# Patient Record
Sex: Female | Born: 1955
Health system: Southern US, Community
[De-identification: ages and names within clinical notes are randomized; demographics above are authoritative.]

## PROBLEM LIST (undated history)

## (undated) DIAGNOSIS — H409 Unspecified glaucoma: Secondary | ICD-10-CM

## (undated) DIAGNOSIS — R32 Unspecified urinary incontinence: Secondary | ICD-10-CM

## (undated) DIAGNOSIS — E039 Hypothyroidism, unspecified: Secondary | ICD-10-CM

## (undated) DIAGNOSIS — F32A Depression, unspecified: Secondary | ICD-10-CM

## (undated) DIAGNOSIS — F419 Anxiety disorder, unspecified: Secondary | ICD-10-CM

## (undated) DIAGNOSIS — R319 Hematuria, unspecified: Secondary | ICD-10-CM

## (undated) DIAGNOSIS — Z79890 Hormone replacement therapy (postmenopausal): Secondary | ICD-10-CM

## (undated) DIAGNOSIS — Z981 Arthrodesis status: Secondary | ICD-10-CM

## (undated) DIAGNOSIS — I10 Essential (primary) hypertension: Secondary | ICD-10-CM

## (undated) DIAGNOSIS — I7300 Raynaud's syndrome without gangrene: Secondary | ICD-10-CM

## (undated) DIAGNOSIS — M8580 Other specified disorders of bone density and structure, unspecified site: Secondary | ICD-10-CM

## (undated) DIAGNOSIS — E041 Nontoxic single thyroid nodule: Secondary | ICD-10-CM

## (undated) DIAGNOSIS — F329 Major depressive disorder, single episode, unspecified: Secondary | ICD-10-CM

## (undated) HISTORY — PX: FOOT TENDON SURGERY: SHX958

## (undated) HISTORY — PX: DIAGNOSTIC LAPAROSCOPY: SUR761

## (undated) HISTORY — DX: Essential (primary) hypertension: I10

## (undated) HISTORY — DX: Nontoxic single thyroid nodule: E04.1

## (undated) HISTORY — DX: Hormone replacement therapy: Z79.890

## (undated) HISTORY — PX: BACK SURGERY: SHX140

## (undated) HISTORY — DX: Raynaud's syndrome without gangrene: I73.00

## (undated) HISTORY — PX: TUBAL LIGATION: SHX77

## (undated) HISTORY — DX: Other specified disorders of bone density and structure, unspecified site: M85.80

## (undated) HISTORY — DX: Arthrodesis status: Z98.1

## (undated) HISTORY — DX: Hypothyroidism, unspecified: E03.9

## (undated) HISTORY — DX: Unspecified urinary incontinence: R32

---

## 1997-06-12 ENCOUNTER — Inpatient Hospital Stay (HOSPITAL_COMMUNITY): Admission: RE | Admit: 1997-06-12 | Discharge: 1997-06-17 | Payer: Self-pay | Admitting: Neurosurgery

## 2000-09-24 ENCOUNTER — Encounter: Payer: Self-pay | Admitting: Internal Medicine

## 2000-09-24 ENCOUNTER — Ambulatory Visit (HOSPITAL_COMMUNITY): Admission: RE | Admit: 2000-09-24 | Discharge: 2000-09-24 | Payer: Self-pay | Admitting: Internal Medicine

## 2000-10-12 ENCOUNTER — Other Ambulatory Visit: Admission: RE | Admit: 2000-10-12 | Discharge: 2000-10-12 | Payer: Self-pay | Admitting: Obstetrics and Gynecology

## 2001-10-26 ENCOUNTER — Encounter: Payer: Self-pay | Admitting: Internal Medicine

## 2001-10-26 ENCOUNTER — Ambulatory Visit (HOSPITAL_COMMUNITY): Admission: RE | Admit: 2001-10-26 | Discharge: 2001-10-26 | Payer: Self-pay | Admitting: Internal Medicine

## 2002-11-14 ENCOUNTER — Ambulatory Visit (HOSPITAL_COMMUNITY): Admission: RE | Admit: 2002-11-14 | Discharge: 2002-11-14 | Payer: Self-pay | Admitting: Internal Medicine

## 2002-11-14 ENCOUNTER — Encounter: Payer: Self-pay | Admitting: Internal Medicine

## 2003-02-05 ENCOUNTER — Ambulatory Visit (HOSPITAL_COMMUNITY): Admission: RE | Admit: 2003-02-05 | Discharge: 2003-02-05 | Payer: Self-pay | Admitting: Urology

## 2003-07-27 ENCOUNTER — Ambulatory Visit (HOSPITAL_COMMUNITY): Admission: RE | Admit: 2003-07-27 | Discharge: 2003-07-27 | Payer: Self-pay | Admitting: Neurosurgery

## 2003-11-29 ENCOUNTER — Ambulatory Visit (HOSPITAL_COMMUNITY): Admission: RE | Admit: 2003-11-29 | Discharge: 2003-11-29 | Payer: Self-pay | Admitting: Obstetrics and Gynecology

## 2004-06-19 ENCOUNTER — Ambulatory Visit (HOSPITAL_COMMUNITY): Admission: RE | Admit: 2004-06-19 | Discharge: 2004-06-19 | Payer: Self-pay | Admitting: Internal Medicine

## 2004-12-01 ENCOUNTER — Ambulatory Visit (HOSPITAL_COMMUNITY): Admission: RE | Admit: 2004-12-01 | Discharge: 2004-12-01 | Payer: Self-pay | Admitting: Internal Medicine

## 2006-07-01 ENCOUNTER — Ambulatory Visit (HOSPITAL_COMMUNITY): Admission: RE | Admit: 2006-07-01 | Discharge: 2006-07-01 | Payer: Self-pay | Admitting: Internal Medicine

## 2007-03-27 ENCOUNTER — Encounter: Admission: RE | Admit: 2007-03-27 | Discharge: 2007-03-27 | Payer: Self-pay | Admitting: Neurosurgery

## 2007-12-08 ENCOUNTER — Ambulatory Visit (HOSPITAL_COMMUNITY): Admission: RE | Admit: 2007-12-08 | Discharge: 2007-12-08 | Payer: Self-pay | Admitting: Internal Medicine

## 2008-05-21 ENCOUNTER — Other Ambulatory Visit: Admission: RE | Admit: 2008-05-21 | Discharge: 2008-05-21 | Payer: Self-pay | Admitting: Obstetrics and Gynecology

## 2008-06-25 ENCOUNTER — Ambulatory Visit (HOSPITAL_COMMUNITY): Admission: RE | Admit: 2008-06-25 | Discharge: 2008-06-25 | Payer: Self-pay | Admitting: Obstetrics & Gynecology

## 2009-04-12 ENCOUNTER — Ambulatory Visit (HOSPITAL_COMMUNITY): Admission: RE | Admit: 2009-04-12 | Discharge: 2009-04-12 | Payer: Self-pay | Admitting: Internal Medicine

## 2009-05-10 ENCOUNTER — Ambulatory Visit (HOSPITAL_COMMUNITY): Admission: RE | Admit: 2009-05-10 | Discharge: 2009-05-10 | Payer: Self-pay | Admitting: Obstetrics & Gynecology

## 2009-09-17 ENCOUNTER — Ambulatory Visit (HOSPITAL_COMMUNITY): Admission: RE | Admit: 2009-09-17 | Discharge: 2009-09-17 | Payer: Self-pay | Admitting: Obstetrics & Gynecology

## 2009-12-19 ENCOUNTER — Ambulatory Visit (HOSPITAL_COMMUNITY): Admission: RE | Admit: 2009-12-19 | Discharge: 2009-12-19 | Payer: Self-pay | Admitting: Internal Medicine

## 2010-03-02 ENCOUNTER — Encounter: Payer: Self-pay | Admitting: Internal Medicine

## 2010-09-11 ENCOUNTER — Ambulatory Visit (HOSPITAL_COMMUNITY): Admission: RE | Admit: 2010-09-11 | Payer: Self-pay | Source: Ambulatory Visit | Admitting: Internal Medicine

## 2010-09-11 ENCOUNTER — Encounter (INDEPENDENT_AMBULATORY_CARE_PROVIDER_SITE_OTHER): Payer: Self-pay | Admitting: Internal Medicine

## 2010-09-11 ENCOUNTER — Encounter (HOSPITAL_COMMUNITY): Admission: RE | Payer: Self-pay | Source: Ambulatory Visit

## 2010-09-11 SURGERY — COLONOSCOPY
Anesthesia: Moderate Sedation

## 2011-05-11 ENCOUNTER — Other Ambulatory Visit (HOSPITAL_COMMUNITY): Payer: Self-pay | Admitting: Internal Medicine

## 2011-05-11 DIAGNOSIS — Z139 Encounter for screening, unspecified: Secondary | ICD-10-CM

## 2011-05-14 ENCOUNTER — Ambulatory Visit (HOSPITAL_COMMUNITY): Payer: Self-pay

## 2011-05-21 ENCOUNTER — Ambulatory Visit (HOSPITAL_COMMUNITY)
Admission: RE | Admit: 2011-05-21 | Discharge: 2011-05-21 | Disposition: A | Discharge status: Home / Self Care | Payer: Medicare Other | Source: Ambulatory Visit | Attending: Internal Medicine | Admitting: Internal Medicine

## 2011-05-21 DIAGNOSIS — Z139 Encounter for screening, unspecified: Secondary | ICD-10-CM

## 2011-05-21 DIAGNOSIS — Z1231 Encounter for screening mammogram for malignant neoplasm of breast: Secondary | ICD-10-CM | POA: Insufficient documentation

## 2011-05-27 ENCOUNTER — Other Ambulatory Visit (HOSPITAL_COMMUNITY)
Admission: RE | Admit: 2011-05-27 | Discharge: 2011-05-27 | Disposition: A | Discharge status: Home / Self Care | Payer: Medicare Other | Source: Ambulatory Visit | Attending: Obstetrics and Gynecology | Admitting: Obstetrics and Gynecology

## 2011-05-27 ENCOUNTER — Other Ambulatory Visit: Payer: Self-pay | Admitting: Adult Health

## 2011-05-27 DIAGNOSIS — Z124 Encounter for screening for malignant neoplasm of cervix: Secondary | ICD-10-CM | POA: Insufficient documentation

## 2011-11-16 ENCOUNTER — Encounter (INDEPENDENT_AMBULATORY_CARE_PROVIDER_SITE_OTHER): Payer: Self-pay | Admitting: *Deleted

## 2011-12-31 ENCOUNTER — Encounter (INDEPENDENT_AMBULATORY_CARE_PROVIDER_SITE_OTHER): Payer: Self-pay | Admitting: *Deleted

## 2011-12-31 ENCOUNTER — Telehealth (INDEPENDENT_AMBULATORY_CARE_PROVIDER_SITE_OTHER): Payer: Self-pay | Admitting: *Deleted

## 2011-12-31 ENCOUNTER — Other Ambulatory Visit (INDEPENDENT_AMBULATORY_CARE_PROVIDER_SITE_OTHER): Payer: Self-pay | Admitting: *Deleted

## 2011-12-31 DIAGNOSIS — Z800 Family history of malignant neoplasm of digestive organs: Secondary | ICD-10-CM

## 2011-12-31 DIAGNOSIS — Z1211 Encounter for screening for malignant neoplasm of colon: Secondary | ICD-10-CM

## 2011-12-31 MED ORDER — PEG-KCL-NACL-NASULF-NA ASC-C 100 G PO SOLR: 1.0000 | Freq: Once | ORAL | Status: DC

## 2011-12-31 NOTE — Telephone Encounter (Signed)
Patient needs movi prep 

## 2012-02-18 ENCOUNTER — Telehealth (INDEPENDENT_AMBULATORY_CARE_PROVIDER_SITE_OTHER): Payer: Self-pay | Admitting: *Deleted

## 2012-02-18 NOTE — Telephone Encounter (Signed)
  Procedure: tcs  Reason/Indication:  Screening, fam hx colon ca  Has patient had this procedure before?  yes  If so, when, by whom and where?  1996  Is there a family history of colon cancer?  yes  Who?  What age when diagnosed?  mother  Is patient diabetic?   no      Does patient have prosthetic heart valve?  no  Do you have a pacemaker?  no  Has patient had joint replacement within last 12 months?  no  Is patient on Coumadin, Plavix and/or Aspirin? no  Medications: trazadone 100 mg daily, zolpidem 10 mg daily, hydrocodone 5/500, lorazepam 0.5 mg prn, escitalopram 20 mg daily, phenergan prn, lomotil prn, vit d 50,000 iu 2 tab weekly, latanoprost eye drop 0.005% 1 drop eye each @ Bedtime  Allergies: narcotics make her itch  Medication Adjustment:   Procedure date & time: 03/16/12 at 730

## 2012-02-18 NOTE — Telephone Encounter (Signed)
agree

## 2012-03-04 ENCOUNTER — Encounter (HOSPITAL_COMMUNITY): Payer: Self-pay | Admitting: Pharmacy Technician

## 2012-03-16 ENCOUNTER — Ambulatory Visit (HOSPITAL_COMMUNITY)
Admission: RE | Admit: 2012-03-16 | Discharge: 2012-03-16 | Disposition: A | Discharge status: Home / Self Care | Payer: Medicare Other | Source: Ambulatory Visit | Attending: Internal Medicine | Admitting: Internal Medicine

## 2012-03-16 ENCOUNTER — Encounter (HOSPITAL_COMMUNITY): Payer: Self-pay

## 2012-03-16 ENCOUNTER — Encounter (HOSPITAL_COMMUNITY)
Admission: RE | Disposition: A | Discharge status: Home / Self Care | Payer: Self-pay | Source: Ambulatory Visit | Attending: Internal Medicine

## 2012-03-16 DIAGNOSIS — Z1211 Encounter for screening for malignant neoplasm of colon: Secondary | ICD-10-CM

## 2012-03-16 DIAGNOSIS — D126 Benign neoplasm of colon, unspecified: Secondary | ICD-10-CM | POA: Insufficient documentation

## 2012-03-16 DIAGNOSIS — Z800 Family history of malignant neoplasm of digestive organs: Secondary | ICD-10-CM

## 2012-03-16 HISTORY — PX: COLONOSCOPY: SHX5424

## 2012-03-16 HISTORY — DX: Unspecified glaucoma: H40.9

## 2012-03-16 HISTORY — DX: Anxiety disorder, unspecified: F41.9

## 2012-03-16 HISTORY — DX: Major depressive disorder, single episode, unspecified: F32.9

## 2012-03-16 HISTORY — DX: Hypothyroidism, unspecified: E03.9

## 2012-03-16 HISTORY — DX: Hematuria, unspecified: R31.9

## 2012-03-16 HISTORY — DX: Depression, unspecified: F32.A

## 2012-03-16 SURGERY — COLONOSCOPY
Anesthesia: Moderate Sedation

## 2012-03-16 MED ORDER — MIDAZOLAM HCL 5 MG/5ML IJ SOLN
INTRAMUSCULAR | Status: AC
  Filled 2012-03-16: qty 5

## 2012-03-16 MED ORDER — STERILE WATER FOR IRRIGATION IR SOLN
Status: DC | PRN
  Administered 2012-03-16: 08:00:00

## 2012-03-16 MED ORDER — MIDAZOLAM HCL 5 MG/5ML IJ SOLN
INTRAMUSCULAR | Status: DC | PRN
  Administered 2012-03-16: 3 mg via INTRAVENOUS
  Administered 2012-03-16 (×3): 2 mg via INTRAVENOUS
  Administered 2012-03-16 (×2): 3 mg via INTRAVENOUS

## 2012-03-16 MED ORDER — SODIUM CHLORIDE 0.45 % IV SOLN
INTRAVENOUS | Status: DC
  Administered 2012-03-16: 07:00:00 via INTRAVENOUS

## 2012-03-16 MED ORDER — FENTANYL CITRATE 0.05 MG/ML IJ SOLN
INTRAMUSCULAR | Status: AC
  Filled 2012-03-16: qty 2

## 2012-03-16 MED ORDER — PROMETHAZINE HCL 25 MG/ML IJ SOLN
INTRAMUSCULAR | Status: AC
  Filled 2012-03-16: qty 1

## 2012-03-16 MED ORDER — MIDAZOLAM HCL 5 MG/5ML IJ SOLN
INTRAMUSCULAR | Status: AC
  Filled 2012-03-16: qty 10

## 2012-03-16 MED ORDER — PROMETHAZINE HCL 25 MG/ML IJ SOLN
INTRAMUSCULAR | Status: DC | PRN
  Administered 2012-03-16 (×2): 12.5 mg via INTRAVENOUS

## 2012-03-16 MED ORDER — FENTANYL CITRATE 0.05 MG/ML IJ SOLN
INTRAMUSCULAR | Status: DC | PRN
  Administered 2012-03-16 (×4): 25 ug via INTRAVENOUS

## 2012-03-16 NOTE — H&P (Signed)
Heather Gill is an 57 y.o. female.   Chief Complaint: Patient is here for colonoscopy. HPI: Patient is 57 year old Caucasian female who is here for screening colonoscopy. Her last exam was in 1996. She has chronic/intermittent diarrhea felt to be due to IBS. She takes Lomotil on when necessary basis. She denies abdominal pain rectal bleeding. His appetite her weight has been stable. Family history significant for colon carcinoma mother she is 85 time of diagnosis and died at 76.  Past Medical History  Diagnosis Date  . Glaucoma   . Anxiety   . Depression   . Hypothyroidism   . Blood in urine     Dr Rito Ehrlich told her 6 yrs ago-no problems    Past Surgical History  Procedure Date  . Tubal ligation   . Diagnostic laparoscopy     dx  . Back surgery     3 surg-last with fusion and rods    History reviewed. No pertinent family history. Social History:  reports that she has never smoked. She does not have any smokeless tobacco history on file. She reports that she drinks about 2.4 ounces of alcohol per week. She reports that she does not use illicit drugs.  Allergies:  Allergies  Allergen Reactions  . Demerol (Meperidine) Itching  . Lortab (Hydrocodone-Acetaminophen) Itching  . Morphine And Related Itching  . Percocet (Oxycodone-Acetaminophen) Itching    Medications Prior to Admission  Medication Sig Dispense Refill  . Calcium Citrate-Vitamin D 500-400 MG-UNIT CHEW Chew 1 tablet by mouth daily.      . diphenoxylate-atropine (LOMOTIL) 2.5-0.025 MG per tablet Take 1 tablet by mouth 4 (four) times daily as needed. Diarrhea      . HYDROcodone-acetaminophen (NORCO/VICODIN) 5-325 MG per tablet Take 1 tablet by mouth every 6 (six) hours as needed. Pain, usually takes a benadryl wit it to control itching.      . latanoprost (XALATAN) 0.005 % ophthalmic solution Place 1 drop into both eyes at bedtime.      Marland Kitchen LORazepam (ATIVAN) 0.5 MG tablet Take 0.5 mg by mouth 3 (three) times daily as  needed. Anxiety      . Multiple Vitamin (MULTIVITAMIN WITH MINERALS) TABS Take 1 tablet by mouth daily.      . peg 3350 powder (MOVIPREP) 100 G SOLR Take 1 kit (100 g total) by mouth once.  1 kit  0  . promethazine (PHENERGAN) 25 MG tablet Take 25 mg by mouth every 6 (six) hours as needed. Nausea and Vomiting      . traZODone (DESYREL) 100 MG tablet Take 100 mg by mouth at bedtime.      Marland Kitchen venlafaxine XR (EFFEXOR-XR) 37.5 MG 24 hr capsule Take 37.5 mg by mouth daily.      Marland Kitchen zolpidem (AMBIEN) 10 MG tablet Take 10 mg by mouth at bedtime. Sleep        No results found for this or any previous visit (from the past 48 hour(s)). No results found.  ROS  Blood pressure 137/93, pulse 93, temperature 98.2 F (36.8 C), temperature source Oral, resp. rate 21, height 5' 6.5" (1.689 m), weight 169 lb (76.658 kg), SpO2 99.00%. Physical Exam  Constitutional: She appears well-developed and well-nourished.  HENT:  Mouth/Throat: Oropharynx is clear and moist.  Eyes: Conjunctivae normal are normal. No scleral icterus.  Neck: No thyromegaly present.  Cardiovascular: Normal rate, regular rhythm and normal heart sounds.   No murmur heard. Respiratory: Effort normal and breath sounds normal.  GI: Soft. She exhibits  no distension and no mass. There is no tenderness.  Musculoskeletal: She exhibits no edema.  Lymphadenopathy:    She has no cervical adenopathy.  Neurological: She is alert.  Skin: Skin is warm and dry.     Assessment/Plan Average risk screening colonoscopy. History of colon carcinoma in first degree relative but late onset.  REHMAN,NAJEEB U 03/16/2012, 7:38 AM

## 2012-03-16 NOTE — Op Note (Signed)
COLONOSCOPY PROCEDURE REPORT  PATIENT:  Heather Gill  MR#:  [id number] Birthdate:  1955/11/24, 57 y.o., female Endoscopist:  Dr. Malissa Hippo, MD Referred By:  Dr. Carylon Perches, MD Procedure Date: 03/16/2012  Procedure:   Colonoscopy  Indications:  Patient is 57 year old Caucasian female was undergoing screening colonoscopy. Family history significant for colon carcinoma in her mother who is 32 at the time of diagnosis.  Informed Consent:  The procedure and risks were reviewed with the patient and informed consent was obtained.  Medications:  Fentanyl 100 mcg IV Versed 15 mg IV Promethazine 25 mg IV in diluted form.  Description of procedure:  After a digital rectal exam was performed, that colonoscope was advanced from the anus through the rectum and colon to the area of the cecum, ileocecal valve and appendiceal orifice. The cecum was deeply intubated. These structures were well-seen and photographed for the record. From the level of the cecum and ileocecal valve, the scope was slowly and cautiously withdrawn. The mucosal surfaces were carefully surveyed utilizing scope tip to flexion to facilitate fold flattening as needed. The scope was pulled down into the rectum where a thorough exam including retroflexion was performed.  Findings:   Prep excellent. 4 mm polyp ablated via cold biopsy from hepatic flexure. Mucosa of rest of the colon was normal. Normal rectal mucosa and anal rectal junction.  Therapeutic/Diagnostic Maneuvers Performed:  See above  Complications:  None  Cecal Withdrawal Time:  7 minutes  Impression:  Examination performed to cecum. Small polyp ablated via cold biopsy from hepatic flexure.  Recommendations:  Standard instructions given. I will contact patient with biopsy results and further recommendations.  REHMAN,NAJEEB U  03/16/2012 8:44 AM  CC: Dr. Carylon Perches, MD & Dr. Bonnetta Barry ref. provider found

## 2012-03-21 ENCOUNTER — Encounter (HOSPITAL_COMMUNITY): Payer: Self-pay | Admitting: Internal Medicine

## 2012-03-23 ENCOUNTER — Encounter (INDEPENDENT_AMBULATORY_CARE_PROVIDER_SITE_OTHER): Payer: Self-pay | Admitting: *Deleted

## 2012-08-29 ENCOUNTER — Other Ambulatory Visit (HOSPITAL_COMMUNITY): Payer: Self-pay | Admitting: Internal Medicine

## 2012-08-29 DIAGNOSIS — Z139 Encounter for screening, unspecified: Secondary | ICD-10-CM

## 2012-09-12 ENCOUNTER — Ambulatory Visit (HOSPITAL_COMMUNITY): Payer: Medicare Other

## 2012-09-27 ENCOUNTER — Other Ambulatory Visit: Payer: Self-pay | Admitting: Adult Health

## 2013-01-17 ENCOUNTER — Telehealth: Payer: Self-pay | Admitting: Adult Health

## 2013-01-17 MED ORDER — CONJ ESTROGENS-BAZEDOXIFENE 0.45-20 MG PO TABS: 1.0000 | ORAL_TABLET | Freq: Every day | ORAL | Status: DC

## 2013-01-17 NOTE — Telephone Encounter (Signed)
Complains of hot flashes, severe, will try duavee lot [id number] exp 8/16 #21 samples given 1 daily

## 2013-02-07 ENCOUNTER — Other Ambulatory Visit: Payer: Self-pay | Admitting: *Deleted

## 2013-02-07 ENCOUNTER — Telehealth: Payer: Self-pay | Admitting: Adult Health

## 2013-02-07 MED ORDER — CONJ ESTROGENS-BAZEDOXIFENE 0.45-20 MG PO TABS: 1.0000 | ORAL_TABLET | Freq: Every day | ORAL | Status: DC

## 2013-02-07 NOTE — Telephone Encounter (Signed)
Is much better with duavee will call in refills

## 2013-02-14 ENCOUNTER — Other Ambulatory Visit: Payer: Self-pay | Admitting: Adult Health

## 2013-02-23 ENCOUNTER — Ambulatory Visit (INDEPENDENT_AMBULATORY_CARE_PROVIDER_SITE_OTHER): Payer: Medicare Other | Admitting: Adult Health

## 2013-02-23 ENCOUNTER — Other Ambulatory Visit (HOSPITAL_COMMUNITY)
Admission: RE | Admit: 2013-02-23 | Discharge: 2013-02-23 | Disposition: A | Discharge status: Home / Self Care | Payer: Medicare Other | Source: Ambulatory Visit | Attending: Adult Health | Admitting: Adult Health

## 2013-02-23 ENCOUNTER — Encounter: Payer: Self-pay | Admitting: Adult Health

## 2013-02-23 VITALS — BP 130/94 | HR 76 | Ht 66.0 in | Wt 187.0 lb

## 2013-02-23 DIAGNOSIS — Z79890 Hormone replacement therapy (postmenopausal): Secondary | ICD-10-CM | POA: Insufficient documentation

## 2013-02-23 DIAGNOSIS — Z1212 Encounter for screening for malignant neoplasm of rectum: Secondary | ICD-10-CM

## 2013-02-23 DIAGNOSIS — R32 Unspecified urinary incontinence: Secondary | ICD-10-CM

## 2013-02-23 DIAGNOSIS — Z1151 Encounter for screening for human papillomavirus (HPV): Secondary | ICD-10-CM | POA: Insufficient documentation

## 2013-02-23 DIAGNOSIS — Z01419 Encounter for gynecological examination (general) (routine) without abnormal findings: Secondary | ICD-10-CM | POA: Insufficient documentation

## 2013-02-23 HISTORY — DX: Unspecified urinary incontinence: R32

## 2013-02-23 LAB — HEMOCCULT GUIAC POC 1CARD (OFFICE): Fecal Occult Blood, POC: NEGATIVE

## 2013-02-23 NOTE — Progress Notes (Signed)
Patient ID: Heather Gill, female   DOB: 09/05/1955, 58 y.o.   MRN: [id number] History of Present Illness: Heather Gill is a 58 year old white female, married in for a pap and physical.   Current Medications, Allergies, Past Medical History, Past Surgical History, Family History and Social History were reviewed in [facility] record.   Past Medical History  Diagnosis Date  . Glaucoma   . Anxiety   . Depression   . Hypothyroidism   . Blood in urine     Dr Maryland Pink told her 6 yrs ago-no problems  . Hx of spinal fusion   . Hormone replacement therapy (HRT)   . Urinary incontinence 02/23/2013  . Thyroid nodule    Past Surgical History  Procedure Laterality Date  . Tubal ligation    . Diagnostic laparoscopy      dx  . Back surgery      3 surg-last with fusion and rods  . Colonoscopy N/A 03/16/2012    Procedure: COLONOSCOPY;  Surgeon: Rogene Houston, MD;  [facility];  Service: Endoscopy;  Laterality: N/A;  730  . Back surgery    Current outpatient prescriptions:Conj Estrogens-Bazedoxifene (DUAVEE) 0.45-20 MG TABS, Take 1 tablet by mouth daily., Disp: 30 tablet, Rfl: 11;  diphenoxylate-atropine (LOMOTIL) 2.5-0.025 MG per tablet, Take 1 tablet by mouth 4 (four) times daily as needed. Diarrhea, Disp: , Rfl:  HYDROcodone-acetaminophen (NORCO/VICODIN) 5-325 MG per tablet, Take 1 tablet by mouth every 6 (six) hours as needed. Pain, usually takes a benadryl wit it to control itching., Disp: , Rfl: ;  latanoprost (XALATAN) 0.005 % ophthalmic solution, Place 1 drop into both eyes at bedtime., Disp: , Rfl: ;  LORazepam (ATIVAN) 0.5 MG tablet, Take 0.5 mg by mouth 3 (three) times daily as needed. Anxiety, Disp: , Rfl:  meloxicam (MOBIC) 7.5 MG tablet, Take 7.5 mg by mouth 2 (two) times daily., Disp: , Rfl: ;  promethazine (PHENERGAN) 25 MG tablet, Take 25 mg by mouth every 6 (six) hours as needed. Nausea and Vomiting, Disp: , Rfl: ;  traZODone (DESYREL) 100 MG tablet, Take 100  mg by mouth at bedtime., Disp: , Rfl: ;  Vortioxetine HBr (BRINTELLIX) 10 MG TABS, Take 1 tablet by mouth daily., Disp: , Rfl:  zolpidem (AMBIEN) 10 MG tablet, Take 10 mg by mouth at bedtime. Sleep, Disp: , Rfl:   Review of Systems: Patient denies any headaches, blurred vision, shortness of breath, chest pain, abdominal pain, problems with bowel movements,  or intercourse. She says her bladder leaks all the time.She is currently having pain right arm, from wrist to shoulder and some in left,may be seeing neurologist in near future.Moods good at present, still sees folks at Dr Arvil Persons office.Is doing great with Duavee, no more hot flashes.    Physical Exam:BP 130/94  Pulse 76  Ht 5\' 6"  (1.676 m)  Wt 187 lb (84.823 kg)  BMI 30.20 kg/m2 General:  Well developed, well nourished, no acute distress Skin:  Warm and dry Neck:  Midline trachea, right thyroid nodule,(sees Dr Moishe Spice in [place]) Lungs; Clear to auscultation bilaterally Breast:  No dominant palpable mass, retraction, or nipple discharge Cardiovascular: Regular rate and rhythm Abdomen:  Soft, non tender, no hepatosplenomegaly Pelvic:  External genitalia is normal in appearance for age.  The vagina is normal in appearance for age. The cervix is smooth, pap performed with HPV.  Uterus is felt to be normal size, shape, and contour.  No adnexal masses or tenderness noted.  Rectal: Good sphincter tone, no polyps, or hemorrhoids felt.  Hemoccult negative. Extremities:  No swelling noted, has some spider veins Psych:  No mood changes. Alert and cooperative, seems happy   Impression: Yearly gyn exam HRT  Urinary leakage    Plan: Physical in 1 year Mammogram yearly Labs in March with Dr Willey Blade Trial myrbetriq 25 mg Number of samples 8 boxes  Lot number [id number]    Exp date 10/16, call in follow up [facility]

## 2013-02-23 NOTE — Patient Instructions (Signed)
Physical in 1 year Mammogram yearly Labs with PCP Urinary Incontinence Urinary incontinence is the involuntary loss of urine from your bladder. CAUSES  There are many causes of urinary incontinence. They include:  Medicines.  Infections.  Prostatic enlargement, leading to overflow of urine from your bladder.  Surgery.  Neurological diseases.  Emotional factors. SIGNS AND SYMPTOMS Urinary Incontinence can be divided into four types: 1. Urge incontinence. Urge incontinence is the involuntary loss of urine before you have the opportunity to go to the bathroom. There is a sudden urge to void but not enough time to reach a bathroom. 2. Stress incontinence. Stress incontinence is the sudden loss of urine with any activity that forces urine to pass. It is commonly caused by anatomical changes to the pelvis and sphincter areas of your body. 3. Overflow incontinence. Overflow incontinence is the loss of urine from an obstructed opening to your bladder. This results in a backup of urine and a resultant buildup of pressure within the bladder. When the pressure within the bladder exceeds the closing pressure of the sphincter, the urine overflows, which causes incontinence, similar to water overflowing a dam. 4. Total incontinence. Total incontinence is the loss of urine as a result of the inability to store urine within your bladder. DIAGNOSIS  Evaluating the cause of incontinence may require:  A thorough and complete medical and obstetric history.  A complete physical exam.  Laboratory tests such as a urine culture and sensitivities. When additional tests are indicated, they can include:  An ultrasound exam.  Kidney and bladder X-rays.  Cystoscopy. This is an exam of the bladder using a narrow scope.  Urodynamic testing to test the nerve function to the bladder and sphincter areas. TREATMENT  Treatment for urinary incontinence depends on the cause:  For urge incontinence caused by a  bacterial infection, antibiotics will be prescribed. If the urge incontinence is related to medicines you take, your health care provider may have you change the medicine.  For stress incontinence, surgery to re-establish anatomical support to the bladder or sphincter, or both, will often correct the condition.  For overflow incontinence caused by an enlarged prostate, an operation to open the channel through the enlarged prostate will allow the flow of urine out of the bladder. In women with fibroids, a hysterectomy may be recommended.  For total incontinence, surgery on your urinary sphincter may help. An artificial urinary sphincter (an inflatable cuff placed around the urethra) may be required. In women who have developed a hole-like passage between their bladder and vagina (vesicovaginal fistula), surgery to close the fistula often is required. HOME CARE INSTRUCTIONS  Normal daily hygiene and the use of pads or adult diapers that are changed regularly will help prevent odors and skin damage.  Avoid caffeine. It can overstimulate your bladder.  Use the bathroom regularly. Try about every 2 3 hours to go to the bathroom, even if you do not feel the need to do so. Take time to empty your bladder completely. After urinating, wait a minute. Then try to urinate again.  For causes involving nerve dysfunction, keep a log of the medicines you take and a journal of the times you go to the bathroom. SEEK MEDICAL CARE IF:  You experience worsening of pain instead of improvement in pain after your procedure.  Your incontinence becomes worse instead of better. SEE IMMEDIATE MEDICAL CARE IF:  You experience fever or shaking chills.  You are unable to pass your urine.  You have redness spreading  into your groin or down into your thighs. MAKE SURE YOU:   Understand these instructions.   Will watch your condition.  Will get help right away if you are not doing well or get worse. Document  Released: 03/05/2004 Document Revised: 11/16/2012 Document Reviewed: 07/05/2012 [facility] Patient Information 2014 [place]. Try myrbetriq

## 2013-03-15 ENCOUNTER — Ambulatory Visit (INDEPENDENT_AMBULATORY_CARE_PROVIDER_SITE_OTHER): Payer: Medicare Other | Admitting: Diagnostic Neuroimaging

## 2013-03-15 ENCOUNTER — Encounter: Payer: Self-pay | Admitting: Diagnostic Neuroimaging

## 2013-03-15 VITALS — BP 119/86 | HR 102 | Temp 98.4°F | Ht 67.0 in | Wt 181.0 lb

## 2013-03-15 DIAGNOSIS — M79609 Pain in unspecified limb: Secondary | ICD-10-CM

## 2013-03-15 DIAGNOSIS — M79641 Pain in right hand: Secondary | ICD-10-CM

## 2013-03-15 NOTE — Progress Notes (Signed)
[facility]  PATIENT: Heather Gill DOB: November 29, 1955  REFERRING CLINICIAN: Fagan HISTORY FROM: patient  REASON FOR VISIT: new consult   HISTORICAL  CHIEF COMPLAINT:  Chief Complaint  Patient presents with  . Neurologic Problem    cervical spondylosis    HISTORY OF PRESENT ILLNESS:   58 year old right-handed female with history of depression, anxiety, multiple lumbar spinal fusion surgeries, hypothyroidism with right thyroid nodule, here for evaluation of right thumb and right hand pain and numbness.  March 2011 patient developed pain in her right thumb. Pain radiates up her right arm and her right shoulder and right neck. Patient also has neck and upper back pain. Sometimes she has symptoms of left hand. Patient was treated over the years with intermittent prednisone packs with mild relief. Patient also MRI of the cervical spine showed mild degenerative changes without significant neural compression.  Symptoms seem to be aggravated by certain hand and manual dexterity maneuvers.   REVIEW OF SYSTEMS: Full 14 system review of systems performed and notable only for insomnia depression anxiety joint pain cramps aching muscles diarrhea.  ALLERGIES: Allergies  Allergen Reactions  . Demerol [Meperidine] Itching  . Lortab [Hydrocodone-Acetaminophen] Itching  . Morphine And Related Itching  . Percocet [Oxycodone-Acetaminophen] Itching    HOME MEDICATIONS: Outpatient Prescriptions Prior to Visit  Medication Sig Dispense Refill  . Conj Estrogens-Bazedoxifene (DUAVEE) 0.45-20 MG TABS Take 1 tablet by mouth daily.  30 tablet  11  . diphenoxylate-atropine (LOMOTIL) 2.5-0.025 MG per tablet Take 1 tablet by mouth 4 (four) times daily as needed. Diarrhea      . HYDROcodone-acetaminophen (NORCO/VICODIN) 5-325 MG per tablet Take 1 tablet by mouth every 6 (six) hours as needed. Pain, usually takes a benadryl wit it to control itching.      . latanoprost (XALATAN) 0.005  % ophthalmic solution Place 1 drop into both eyes at bedtime.      Marland Kitchen LORazepam (ATIVAN) 0.5 MG tablet Take 0.5 mg by mouth 3 (three) times daily as needed. Anxiety      . meloxicam (MOBIC) 7.5 MG tablet Take 7.5 mg by mouth 2 (two) times daily.      . promethazine (PHENERGAN) 25 MG tablet Take 25 mg by mouth every 6 (six) hours as needed. Nausea and Vomiting      . traZODone (DESYREL) 100 MG tablet Take 100 mg by mouth at bedtime.      . Vortioxetine HBr (BRINTELLIX) 10 MG TABS Take 1 tablet by mouth daily.      Marland Kitchen zolpidem (AMBIEN) 10 MG tablet Take 10 mg by mouth at bedtime. Sleep       No facility-administered medications prior to visit.    PAST MEDICAL HISTORY: Past Medical History  Diagnosis Date  . Glaucoma   . Anxiety   . Depression   . Hypothyroidism   . Blood in urine     Dr Maryland Pink told her 6 yrs ago-no problems  . Hx of spinal fusion   . Hormone replacement therapy (HRT)   . Urinary incontinence 02/23/2013  . Thyroid nodule     PAST SURGICAL HISTORY: Past Surgical History  Procedure Laterality Date  . Tubal ligation    . Diagnostic laparoscopy      dx  . Back surgery      3 surg-last with fusion and rods  . Colonoscopy N/A 03/16/2012    Procedure: COLONOSCOPY;  Surgeon: Rogene Houston, MD;  Location: [facility];  Service: Endoscopy;  Laterality: N/A;  730  . Back surgery      FAMILY HISTORY: Family History  Problem Relation Age of Onset  . Cancer Mother     colon  . Varicose Veins Mother   . Stroke Father   . Hypertension Sister   . Cancer Paternal Aunt   . Cancer Paternal Uncle     SOCIAL HISTORY:  History   Social History  . Marital Status: Married    Spouse Name: Gwyndolyn Saxon    Number of Children: 2  . Years of Education: BA   Occupational History  .  Other    disabled   Social History Main Topics  . Smoking status: Never Smoker   . Smokeless tobacco: Never Used  . Alcohol Use: 2.4 oz/week    4 Glasses of wine per week     Comment: wine  2-3 glasses 2-3 days out of the week  . Drug Use: No  . Sexual Activity: Yes    Birth Control/ Protection: Post-menopausal   Other Topics Concern  . Not on file   Social History Narrative   Patient lives at home with spouse.   Caffeine Use: 3 Mt. Dew's 12oz. can daily     PHYSICAL EXAM  Filed Vitals:   03/15/13 1109  BP: 119/86  Pulse: 102  Temp: 98.4 F (36.9 C)  TempSrc: Oral  Height: 5\' 7"  (1.702 m)  Weight: 181 lb (82.101 kg)    Not recorded    Body mass index is 28.34 kg/(m^2).  GENERAL EXAM: Patient is in no distress; well developed, nourished and groomed; neck is supple  CARDIOVASCULAR: Regular rate and rhythm, no murmurs, no carotid bruits  NEUROLOGIC: MENTAL STATUS: awake, alert, oriented to person, place and time, recent and remote memory intact, normal attention and concentration, language fluent, comprehension intact, naming intact, fund of knowledge appropriate CRANIAL NERVE: no papilledema on fundoscopic exam, pupils equal and reactive to light, visual fields full to confrontation, extraocular muscles intact, no nystagmus, facial sensation and strength symmetric, hearing intact, palate elevates symmetrically, uvula midline, shoulder shrug symmetric, tongue midline. MOTOR: normal bulk and tone, full strength in the BUE, BLE; MILD ATROPHY OF R > L APB; MILD RIGHT APB WEAKNESS. POSITIVE PHALEN'S ON RIGHT. NEG TINEL'S. SENSORY: normal and symmetric to light touch, pinprick, temperature, vibratio; EXCEPT SLIGHTLY DECR IN RIGHT THUMB TO PP. COORDINATION: finger-nose-finger, fine finger movements normal REFLEXES: RUE 1, LUE 2, KNEES TRACE, ANKLES 0.  GAIT/STATION: narrow based gait; able to walk on toes, heels and tandem; romberg is negative    DIAGNOSTIC DATA (LABS, IMAGING, TESTING) - I reviewed patient records, labs, notes, testing and imaging myself where available.  No results found for this basename: WBC, HGB, HCT, MCV, PLT   No results found for this  basename: na, k, cl, co2, glucose, bun, creatinine, calcium, prot, albumin, ast, alt, alkphos, bilitot, gfrnonaa, gfraa   No results found for this basename: CHOL, HDL, LDLCALC, LDLDIRECT, TRIG, CHOLHDL   No results found for this basename: HGBA1C   No results found for this basename: VITAMINB12   No results found for this basename: TSH   I reviewed images myself and agree with interpretation. -VRP   01/18/13 MRI cervical spine - Minimal disc bulges C5-6 and C6-7; no spinal stenosis or foraminal narrowing    ASSESSMENT AND PLAN  58 y.o. year old female here with intermittent right thumb, right hand, right arm numbness and pain since 2011. Exam notable for slight decreased pinprick sensation right thumb, subtle right abductor pollicis  brevis atrophy and weakness, positive Phalen's on the right. Findings suspicious for right carpal tunnel syndrome. We'll check EMG nerve conduction study for further evaluation.  PLAN: -EMG/NCS  Orders Placed This Encounter  Procedures  . NCV with EMG(electromyography)   Return for emg.    Penni Bombard, MD 08/13/1698, 17:49 PM Certified in Neurology, Neurophysiology and Neuroimaging  [facility] [address], [place], [place] [address] [phone]

## 2013-03-27 ENCOUNTER — Encounter: Payer: Medicare Other | Admitting: Diagnostic Neuroimaging

## 2013-04-04 ENCOUNTER — Encounter: Payer: Medicare Other | Admitting: Diagnostic Neuroimaging

## 2013-04-11 ENCOUNTER — Encounter (INDEPENDENT_AMBULATORY_CARE_PROVIDER_SITE_OTHER): Payer: Self-pay

## 2013-04-11 ENCOUNTER — Ambulatory Visit (INDEPENDENT_AMBULATORY_CARE_PROVIDER_SITE_OTHER): Payer: Medicare Other | Admitting: Diagnostic Neuroimaging

## 2013-04-11 DIAGNOSIS — M79609 Pain in unspecified limb: Secondary | ICD-10-CM

## 2013-04-11 DIAGNOSIS — Z0289 Encounter for other administrative examinations: Secondary | ICD-10-CM

## 2013-04-11 DIAGNOSIS — M79641 Pain in right hand: Secondary | ICD-10-CM

## 2013-04-11 NOTE — Procedures (Signed)
   [facility]  NCS (NERVE CONDUCTION STUDY) WITH EMG (ELECTROMYOGRAPHY) REPORT   STUDY DATE: 04/11/13 PATIENT NAME: Heather Gill DOB: [birth year] MRN: [id number]  ORDERING CLINICIAN: Andrey Spearman, MD   TECHNOLOGIST: Laretta Alstrom ELECTROMYOGRAPHER: Earlean Polka. Florena Kozma, MD  CLINICAL INFORMATION: 58 year old female with upper extremity pain and numbness.  FINDINGS: NERVE CONDUCTION STUDY: Bilateral median and ulnar motor responses have normal distal latencies, amplitudes, conduction velocities and F-wave latencies. Bilateral median and ulnar sensory responses are normal. Bilateral median and ulnar transcarpal mixed nerve responses are normal.  NEEDLE ELECTROMYOGRAPHY: Needle examination of the right upper extremity (deltoid, biceps, triceps, flexor carpi radialis, first dorsal interosseous) shows no abnormal spontaneous activity at rest and normal motor unit recruitment on exertion. Right C6-7 paraspinal muscles are normal.  IMPRESSION:  This is a normal study. No electrodiagnostic evidence of large fiber neuropathy or right cervical radiculopathy at this time.   INTERPRETING PHYSICIAN:  Penni Bombard, MD Certified in Neurology, Neurophysiology and Neuroimaging  [facility] [address] [place], [place] [address] [phone]

## 2013-04-25 ENCOUNTER — Ambulatory Visit (HOSPITAL_COMMUNITY)
Admission: RE | Admit: 2013-04-25 | Discharge: 2013-04-25 | Disposition: A | Discharge status: Home / Self Care | Payer: Medicare Other | Source: Ambulatory Visit | Attending: Internal Medicine | Admitting: Internal Medicine

## 2013-04-25 DIAGNOSIS — Z1231 Encounter for screening mammogram for malignant neoplasm of breast: Secondary | ICD-10-CM | POA: Insufficient documentation

## 2013-04-25 DIAGNOSIS — Z139 Encounter for screening, unspecified: Secondary | ICD-10-CM

## 2013-05-02 LAB — TSH: TSH: 5.56 u[IU]/mL (ref <=5.90)

## 2013-05-17 ENCOUNTER — Encounter: Payer: Self-pay | Admitting: Adult Health

## 2013-06-28 ENCOUNTER — Telehealth: Payer: Self-pay | Admitting: Adult Health

## 2013-06-28 MED ORDER — CONJ ESTROGENS-BAZEDOXIFENE 0.45-20 MG PO TABS: ORAL_TABLET | ORAL | Status: DC

## 2013-06-28 NOTE — Telephone Encounter (Signed)
Needs duavee sample, too expensive given 168 tabs = 24 boxes lot #[id number] exp 6/17

## 2013-07-13 ENCOUNTER — Telehealth: Payer: Self-pay | Admitting: Adult Health

## 2013-07-13 MED ORDER — MIRABEGRON ER 25 MG PO TB24: 25.0000 mg | ORAL_TABLET | Freq: Every day | ORAL | Status: DC

## 2013-07-13 NOTE — Telephone Encounter (Signed)
Gave 49 tabs/7 boxes of myrbetriq    3boxes [id number] exp 6/16 4boxes lot [id number] exp 3/17

## 2013-09-12 ENCOUNTER — Telehealth: Payer: Self-pay | Admitting: Adult Health

## 2013-09-12 MED ORDER — VALACYCLOVIR HCL 1 G PO TABS: ORAL_TABLET | ORAL | Status: DC

## 2013-09-12 NOTE — Telephone Encounter (Signed)
Has cold sore wants valtrex, will rx valtrex 1 gm #10 2 now and 2 in am with 3 refills at [facility]

## 2013-12-11 ENCOUNTER — Encounter: Payer: Self-pay | Admitting: Diagnostic Neuroimaging

## 2014-04-11 DIAGNOSIS — M47816 Spondylosis without myelopathy or radiculopathy, lumbar region: Secondary | ICD-10-CM | POA: Diagnosis not present

## 2014-04-11 DIAGNOSIS — M4302 Spondylolysis, cervical region: Secondary | ICD-10-CM | POA: Diagnosis not present

## 2014-04-24 DIAGNOSIS — M542 Cervicalgia: Secondary | ICD-10-CM | POA: Diagnosis not present

## 2014-04-24 DIAGNOSIS — E785 Hyperlipidemia, unspecified: Secondary | ICD-10-CM | POA: Diagnosis not present

## 2014-04-24 DIAGNOSIS — Z23 Encounter for immunization: Secondary | ICD-10-CM | POA: Diagnosis not present

## 2014-05-01 DIAGNOSIS — E785 Hyperlipidemia, unspecified: Secondary | ICD-10-CM | POA: Diagnosis not present

## 2014-05-01 DIAGNOSIS — Z0000 Encounter for general adult medical examination without abnormal findings: Secondary | ICD-10-CM | POA: Diagnosis not present

## 2014-05-01 DIAGNOSIS — Z79899 Other long term (current) drug therapy: Secondary | ICD-10-CM | POA: Diagnosis not present

## 2014-05-31 DIAGNOSIS — H4011X1 Primary open-angle glaucoma, mild stage: Secondary | ICD-10-CM | POA: Diagnosis not present

## 2014-05-31 DIAGNOSIS — H524 Presbyopia: Secondary | ICD-10-CM | POA: Diagnosis not present

## 2014-05-31 DIAGNOSIS — H52223 Regular astigmatism, bilateral: Secondary | ICD-10-CM | POA: Diagnosis not present

## 2014-05-31 DIAGNOSIS — H5213 Myopia, bilateral: Secondary | ICD-10-CM | POA: Diagnosis not present

## 2014-07-24 DIAGNOSIS — M47816 Spondylosis without myelopathy or radiculopathy, lumbar region: Secondary | ICD-10-CM | POA: Diagnosis not present

## 2014-07-24 DIAGNOSIS — M4302 Spondylolysis, cervical region: Secondary | ICD-10-CM | POA: Diagnosis not present

## 2014-08-01 DIAGNOSIS — M6240 Contracture of muscle, unspecified site: Secondary | ICD-10-CM | POA: Diagnosis not present

## 2014-08-01 DIAGNOSIS — R51 Headache: Secondary | ICD-10-CM | POA: Diagnosis not present

## 2014-08-01 DIAGNOSIS — M2560 Stiffness of unspecified joint, not elsewhere classified: Secondary | ICD-10-CM | POA: Diagnosis not present

## 2014-08-01 DIAGNOSIS — M542 Cervicalgia: Secondary | ICD-10-CM | POA: Diagnosis not present

## 2014-11-20 ENCOUNTER — Other Ambulatory Visit (HOSPITAL_COMMUNITY): Payer: Self-pay | Admitting: Internal Medicine

## 2014-11-20 DIAGNOSIS — Z1231 Encounter for screening mammogram for malignant neoplasm of breast: Secondary | ICD-10-CM

## 2014-11-27 DIAGNOSIS — M4302 Spondylolysis, cervical region: Secondary | ICD-10-CM | POA: Diagnosis not present

## 2014-11-27 DIAGNOSIS — M25851 Other specified joint disorders, right hip: Secondary | ICD-10-CM | POA: Diagnosis not present

## 2014-11-27 DIAGNOSIS — M47816 Spondylosis without myelopathy or radiculopathy, lumbar region: Secondary | ICD-10-CM | POA: Diagnosis not present

## 2014-12-05 ENCOUNTER — Ambulatory Visit (HOSPITAL_COMMUNITY): Payer: Self-pay

## 2014-12-20 ENCOUNTER — Ambulatory Visit (HOSPITAL_COMMUNITY): Payer: Self-pay

## 2014-12-27 ENCOUNTER — Ambulatory Visit (HOSPITAL_COMMUNITY)
Admission: RE | Admit: 2014-12-27 | Discharge: 2014-12-27 | Disposition: A | Discharge status: Home / Self Care | Payer: Medicare Other | Source: Ambulatory Visit | Attending: Internal Medicine | Admitting: Internal Medicine

## 2014-12-27 DIAGNOSIS — Z1231 Encounter for screening mammogram for malignant neoplasm of breast: Secondary | ICD-10-CM | POA: Insufficient documentation

## 2015-01-08 ENCOUNTER — Other Ambulatory Visit: Payer: Self-pay | Admitting: Adult Health

## 2015-01-15 ENCOUNTER — Other Ambulatory Visit: Payer: Self-pay | Admitting: Adult Health

## 2015-01-22 ENCOUNTER — Encounter: Payer: Self-pay | Admitting: Adult Health

## 2015-01-22 ENCOUNTER — Ambulatory Visit (INDEPENDENT_AMBULATORY_CARE_PROVIDER_SITE_OTHER): Payer: Medicare Other | Admitting: Adult Health

## 2015-01-22 VITALS — BP 120/90 | HR 96 | Ht 66.25 in | Wt 185.0 lb

## 2015-01-22 DIAGNOSIS — Z01419 Encounter for gynecological examination (general) (routine) without abnormal findings: Secondary | ICD-10-CM | POA: Diagnosis not present

## 2015-01-22 DIAGNOSIS — Z1212 Encounter for screening for malignant neoplasm of rectum: Secondary | ICD-10-CM | POA: Diagnosis not present

## 2015-01-22 DIAGNOSIS — E039 Hypothyroidism, unspecified: Secondary | ICD-10-CM

## 2015-01-22 DIAGNOSIS — N3943 Post-void dribbling: Secondary | ICD-10-CM

## 2015-01-22 HISTORY — DX: Hypothyroidism, unspecified: E03.9

## 2015-01-22 LAB — HEMOCCULT GUIAC POC 1CARD (OFFICE): Fecal Occult Blood, POC: NEGATIVE

## 2015-01-22 MED ORDER — VALACYCLOVIR HCL 1 G PO TABS: ORAL_TABLET | ORAL | Status: DC

## 2015-01-22 NOTE — Progress Notes (Signed)
Patient ID: Heather Gill, female   DOB: [birth year], 57 y.o.   MRN: [id number] History of Present Illness: Kaycie is a 59 year old white female, married, G2 P2, in for a well woman gyn exam, she had a normal pap with negative HPV 02/23/13.She complains of leaking urine after she voids.She has been seeing Dr Arcola Jansky in [place] but wants to change. PCP is Dr Willey Blade.GI is Dr Laural Golden, Dr Glenna Fellows is back doctor and she sees Dr Letta Moynahan in [place]. She had labs with Dr Willey Blade this year.   Current Medications, Allergies, Past Medical History, Past Surgical History, Family History and Social History were reviewed in [employer] record.     Review of Systems: Patient denies any headaches, hearing loss, fatigue, blurred vision, shortness of breath, chest pain, abdominal pain, problems with bowel movements, or intercourse. No joint pain or mood swings. See HPI for positives.   Physical Exam:BP 120/90 mmHg  Pulse 96  Ht 5' 6.25" (1.683 m)  Wt 185 lb (83.915 kg)  BMI 29.63 kg/m2 General:  Well developed, well nourished, no acute distress Skin:  Warm and dry Neck:  Midline trachea, thyroid nodule on right, good ROM, no lymphadenopathy Lungs; Clear to auscultation bilaterally Breast:  No dominant palpable mass, retraction, or nipple discharge Cardiovascular: Regular rate and rhythm Abdomen:  Soft, non tender, no hepatosplenomegaly Pelvic:  External genitalia is normal in appearance, no lesions.  The vagina has decreased color, moisture and rugae. Urethra has no lesions or masses. The cervix is smooth.  Uterus is felt to be normal size, shape, and contour.  No adnexal masses or tenderness noted.Bladder is non tender, no masses felt. Rectal: Good sphincter tone, no polyps, or hemorrhoids felt.  Hemoccult negative. Extremities/musculoskeletal:  No swelling or severe varicosities noted, no clubbing or cyanosis Psych:  No mood changes, alert and cooperative,seems  happy Will refer to Dr Dorris Fetch for thyroid, probably needs [place].  Impression: Well woman gyn exam no pap Post void dribbling  History of hypothyroidism   Plan: Refer to Dr Dorris Fetch Physical in 1 year Mammogram yearly  Get flu shot  Colonoscopy in 2023 Refill valtrex 1 gm #10 take 2 then 2 in 24 hours as needed for cold sores, with 2 refills  Void then stand then sit and void again, if no help let me know will refer to urologist

## 2015-01-22 NOTE — Patient Instructions (Addendum)
Physical in 1 year Mammogram yearly Refer to Dr Dorris Fetch Get flu shot

## 2015-02-12 ENCOUNTER — Other Ambulatory Visit: Payer: Self-pay | Admitting: Adult Health

## 2015-02-12 DIAGNOSIS — E039 Hypothyroidism, unspecified: Secondary | ICD-10-CM

## 2015-02-15 DIAGNOSIS — H401112 Primary open-angle glaucoma, right eye, moderate stage: Secondary | ICD-10-CM | POA: Diagnosis not present

## 2015-02-15 DIAGNOSIS — H401121 Primary open-angle glaucoma, left eye, mild stage: Secondary | ICD-10-CM | POA: Diagnosis not present

## 2015-03-05 DIAGNOSIS — M47816 Spondylosis without myelopathy or radiculopathy, lumbar region: Secondary | ICD-10-CM | POA: Diagnosis not present

## 2015-03-05 DIAGNOSIS — M4302 Spondylolysis, cervical region: Secondary | ICD-10-CM | POA: Diagnosis not present

## 2015-03-08 ENCOUNTER — Ambulatory Visit: Payer: Medicare Other | Admitting: "Endocrinology

## 2015-03-18 ENCOUNTER — Ambulatory Visit: Payer: Medicare Other | Admitting: "Endocrinology

## 2015-04-03 ENCOUNTER — Ambulatory Visit (INDEPENDENT_AMBULATORY_CARE_PROVIDER_SITE_OTHER): Payer: Medicare Other | Admitting: "Endocrinology

## 2015-04-03 ENCOUNTER — Encounter: Payer: Self-pay | Admitting: "Endocrinology

## 2015-04-03 VITALS — BP 137/86 | HR 84 | Ht 66.25 in | Wt 185.0 lb

## 2015-04-03 DIAGNOSIS — E559 Vitamin D deficiency, unspecified: Secondary | ICD-10-CM | POA: Diagnosis not present

## 2015-04-03 DIAGNOSIS — M8580 Other specified disorders of bone density and structure, unspecified site: Secondary | ICD-10-CM | POA: Diagnosis not present

## 2015-04-03 DIAGNOSIS — E039 Hypothyroidism, unspecified: Secondary | ICD-10-CM | POA: Diagnosis not present

## 2015-04-03 NOTE — Progress Notes (Signed)
Subjective:    Patient ID: Heather Gill, female    DOB: 29-Dec-1955, PCP Asencion Noble, MD   Past Medical History  Diagnosis Date  . Glaucoma   . Anxiety   . Depression   . Hypothyroidism   . Blood in urine     Dr Maryland Pink told her 6 yrs ago-no problems  . Hx of spinal fusion   . Hormone replacement therapy (HRT)   . Urinary incontinence 02/23/2013  . Thyroid nodule   . Osteopenia   . Hypothyroid 01/22/2015   Past Surgical History  Procedure Laterality Date  . Tubal ligation    . Diagnostic laparoscopy      dx  . Back surgery      3 surg-last with fusion and rods  . Colonoscopy N/A 03/16/2012    Procedure: COLONOSCOPY;  Surgeon: Rogene Houston, MD;  Location: [facility];  Service: Endoscopy;  Laterality: N/A;  730  . Back surgery     Social History   Social History  . Marital Status: Married    Spouse Name: Gwyndolyn Saxon  . Number of Children: 2  . Years of Education: BA   Occupational History  .  Other    disabled   Social History Main Topics  . Smoking status: Never Smoker   . Smokeless tobacco: Never Used  . Alcohol Use: 2.4 oz/week    4 Glasses of wine per week     Comment: wine 2-3 glasses 2-3 days out of the week  . Drug Use: No  . Sexual Activity: Yes    Birth Control/ Protection: Post-menopausal, Surgical     Comment: tubal   Other Topics Concern  . None   Social History Narrative   Patient lives at home with spouse.   Caffeine Use: 3 Mt. Dew's 12oz. can daily   Outpatient Encounter Prescriptions as of 04/03/2015  Medication Sig  . diphenoxylate-atropine (LOMOTIL) 2.5-0.025 MG per tablet Take 1 tablet by mouth 4 (four) times daily as needed. Diarrhea  . HYDROcodone-acetaminophen (NORCO/VICODIN) 5-325 MG per tablet Take 1 tablet by mouth every 6 (six) hours as needed. Pain, usually takes a benadryl wit it to control itching.  . latanoprost (XALATAN) 0.005 % ophthalmic solution Place 1 drop into both eyes at bedtime.  Marland Kitchen LORazepam (ATIVAN) 0.5 MG  tablet Take 0.5 mg by mouth 3 (three) times daily as needed. Anxiety  . meloxicam (MOBIC) 7.5 MG tablet Take 7.5 mg by mouth as needed.   . promethazine (PHENERGAN) 25 MG tablet Take 25 mg by mouth every 6 (six) hours as needed. Nausea and Vomiting  . traZODone (DESYREL) 100 MG tablet Take 100 mg by mouth at bedtime.  . valACYclovir (VALTREX) 1000 MG tablet Take 2 today an 2 in am for cold sore prn  . venlafaxine XR (EFFEXOR-XR) 75 MG 24 hr capsule 75 mg daily.   Marland Kitchen zolpidem (AMBIEN) 5 MG tablet Take 5 mg by mouth at bedtime as needed for sleep.   No facility-administered encounter medications on file as of 04/03/2015.   ALLERGIES: Allergies  Allergen Reactions  . Demerol [Meperidine] Itching  . Lortab [Hydrocodone-Acetaminophen] Itching  . Morphine And Related Itching  . Percocet [Oxycodone-Acetaminophen] Itching   VACCINATION STATUS:  There is no immunization history on file for this patient.  HPI  60 year old female patient with medical history as above. She is being seen in consultation for history of hypothyroidism requested by Dr. Willey Blade. She brings a medical history package showing that she did have  thyroiditis in the late 90s with slightly above target TSH for which she was briefly treated with Synthroid but which was stopped several years ago. Based on thyroid function tests from 2015, the last time she seemed to have thyroid function test , TSH was significantly above target at 5.56 along with low normal thyroid hormone. She does have severely elevated triglycerides and high total cholesterol. She has multiple medications for mood disorder including depression. She denies heat or cold intolerance. She denies neck swelling currently. She did have thyroid ultrasound in 2010 which showed right lobe of thyroid was 4.3 cm and left lobe was 3.6 cm with no discrete nodules. -She gives some progressive weight loss related to her GI problem for which she is on close follow up with GI  services. -She was also diagnosed with osteopenia in the past however she is not on medications. She gives a remote history of stress fracture. She did not have DEXA scan since 2015.  Review of Systems  Constitutional: +weight loss, + fatigue, no subjective hyperthermia/hypothermia Eyes: no blurry vision, no xerophthalmia ENT: no sore throat, no nodules palpated in throat, no dysphagia/odynophagia, no hoarseness Cardiovascular: no CP/SOB/palpitations/leg swelling Respiratory: no cough/SOB Gastrointestinal: no N/V/D/C Musculoskeletal: no muscle/joint aches Skin: no rashes Neurological: no tremors/numbness/tingling/dizziness Psychiatric: no depression/anxiety  Objective:    BP 137/86 mmHg  Pulse 84  Ht 5' 6.25" (1.683 m)  Wt 185 lb (83.915 kg)  BMI 29.63 kg/m2  SpO2 95%  Wt Readings from Last 3 Encounters:  04/03/15 185 lb (83.915 kg)  01/22/15 185 lb (83.915 kg)  03/15/13 181 lb (82.101 kg)    Physical Exam   Constitutional: overweight, in NAD Eyes: PERRLA, EOMI, no exophthalmos ENT: moist mucous membranes, no thyromegaly, no cervical lymphadenopathy Cardiovascular: RRR, No MRG Respiratory: CTA B Gastrointestinal: abdomen soft, NT, ND, BS+ Musculoskeletal: no deformities, strength intact in all 4 Skin: moist, warm, no rashes Neurological: no tremor with outstretched hands, DTR normal in all 4  March 2,2016:  total cholesterol 301, triglycerides 263, HDL 88, LDL 160 05/02/2013 :TSH 5.56, total T4 7 0.4, T3 uptake 28.9, free thyroxine index 2.1  bone density study in 04/19/2013 keep T score was -2.2- showing 20-25% below Average bone mass. 05/03/2012 TSH was elevated at 5.04 along with total T4 of 6.9. -Complete lab studies to be scanned into her records. Assessment & Plan:   1.  Subclinical Hypothyroidism - Based on her last 2 thyroid function tests, she may benefit from initiation of thyroid hormone replacement. However, I will proceed to obtain new set of thyroid  function test on her way home today. She will return in 1 week for treatment decision.  - Based on a 2010 thyroid ultrasound she does not have nodular lesions, however it may be necessary to repeat thyroid ultrasound for repeat study.   2. Osteopenia -Her T score was -2.2 in March 2015. This puts her in the osteopenia category. No clear evidence of trivial fracture. I will proceed to obtain new DEXA scan in [place], since she does not want to go to [facility] to see Dr. Ronnald Collum anymore. -I will obtain BMP and vitamin D along with her labs today .   - I advised patient to maintain close follow up with Asencion Noble, MD for primary care needs. Follow up plan: Return in about 1 week (around 04/10/2015) for labs today, underactive thyroid.  Glade Lloyd, MD Phone: [phone]  Fax: [phone]   04/03/2015, 4:20 PM

## 2015-04-04 LAB — BASIC METABOLIC PANEL
BUN: 8 mg/dL (ref 7–25)
CALCIUM: 9.8 mg/dL (ref 8.6–10.4)
CO2: 26 mmol/L (ref 20–31)
Chloride: 104 mmol/L (ref 98–110)
Creat: 0.93 mg/dL (ref 0.50–1.05)
GLUCOSE: 89 mg/dL (ref 65–99)
POTASSIUM: 4.1 mmol/L (ref 3.5–5.3)
SODIUM: 139 mmol/L (ref 135–146)

## 2015-04-04 LAB — TSH: TSH: 3.07 mIU/L

## 2015-04-04 LAB — THYROID PEROXIDASE ANTIBODY: Thyroperoxidase Ab SerPl-aCnc: <1 IU/mL (ref <9)

## 2015-04-04 LAB — T4, FREE: Free T4: 1 ng/dL (ref 0.8–1.8)

## 2015-04-04 LAB — VITAMIN D 25 HYDROXY (VIT D DEFICIENCY, FRACTURES): VIT D 25 HYDROXY: 16 ng/mL — AB (ref 30–100)

## 2015-04-04 LAB — THYROGLOBULIN ANTIBODY: Thyroglobulin Ab: <1 IU/mL (ref <2)

## 2015-04-09 ENCOUNTER — Other Ambulatory Visit: Payer: Self-pay | Admitting: "Endocrinology

## 2015-04-09 DIAGNOSIS — M8580 Other specified disorders of bone density and structure, unspecified site: Secondary | ICD-10-CM

## 2015-04-11 ENCOUNTER — Ambulatory Visit: Payer: Medicare Other | Admitting: "Endocrinology

## 2015-04-16 ENCOUNTER — Other Ambulatory Visit: Payer: Self-pay | Admitting: "Endocrinology

## 2015-04-19 ENCOUNTER — Other Ambulatory Visit (HOSPITAL_COMMUNITY): Payer: Medicare Other

## 2015-04-22 ENCOUNTER — Telehealth: Payer: Self-pay | Admitting: "Endocrinology

## 2015-04-22 NOTE — Telephone Encounter (Signed)
she said that her procedure needs to have post menopause or something to do with her meds - her insurance won't cover it with just being osteopenia

## 2015-04-22 NOTE — Telephone Encounter (Signed)
ICD 10 code was put on order and faxed back to [facility] radiology

## 2015-04-25 ENCOUNTER — Ambulatory Visit: Payer: Medicare Other | Admitting: "Endocrinology

## 2015-04-30 ENCOUNTER — Other Ambulatory Visit: Payer: Self-pay | Admitting: "Endocrinology

## 2015-04-30 DIAGNOSIS — Z780 Asymptomatic menopausal state: Secondary | ICD-10-CM

## 2015-04-30 DIAGNOSIS — M8580 Other specified disorders of bone density and structure, unspecified site: Secondary | ICD-10-CM

## 2015-04-30 DIAGNOSIS — M810 Age-related osteoporosis without current pathological fracture: Secondary | ICD-10-CM

## 2015-05-01 ENCOUNTER — Ambulatory Visit (HOSPITAL_COMMUNITY)
Admission: RE | Admit: 2015-05-01 | Discharge: 2015-05-01 | Disposition: A | Discharge status: Home / Self Care | Payer: Medicare Other | Source: Ambulatory Visit | Attending: "Endocrinology | Admitting: "Endocrinology

## 2015-05-01 DIAGNOSIS — M8580 Other specified disorders of bone density and structure, unspecified site: Secondary | ICD-10-CM | POA: Insufficient documentation

## 2015-05-01 DIAGNOSIS — Z780 Asymptomatic menopausal state: Secondary | ICD-10-CM | POA: Diagnosis not present

## 2015-05-01 DIAGNOSIS — M25852 Other specified joint disorders, left hip: Secondary | ICD-10-CM | POA: Diagnosis not present

## 2015-05-08 DIAGNOSIS — L820 Inflamed seborrheic keratosis: Secondary | ICD-10-CM | POA: Diagnosis not present

## 2015-05-08 DIAGNOSIS — D2239 Melanocytic nevi of other parts of face: Secondary | ICD-10-CM | POA: Diagnosis not present

## 2015-05-08 DIAGNOSIS — D225 Melanocytic nevi of trunk: Secondary | ICD-10-CM | POA: Diagnosis not present

## 2015-05-15 ENCOUNTER — Encounter: Payer: Self-pay | Admitting: "Endocrinology

## 2015-05-15 ENCOUNTER — Ambulatory Visit (INDEPENDENT_AMBULATORY_CARE_PROVIDER_SITE_OTHER): Payer: Medicare Other | Admitting: "Endocrinology

## 2015-05-15 VITALS — BP 122/84 | HR 86 | Ht 66.25 in | Wt 184.0 lb

## 2015-05-15 DIAGNOSIS — M8580 Other specified disorders of bone density and structure, unspecified site: Secondary | ICD-10-CM

## 2015-05-15 DIAGNOSIS — E559 Vitamin D deficiency, unspecified: Secondary | ICD-10-CM

## 2015-05-15 DIAGNOSIS — E079 Disorder of thyroid, unspecified: Secondary | ICD-10-CM

## 2015-05-15 MED ORDER — VITAMIN D (ERGOCALCIFEROL) 1.25 MG (50000 UNIT) PO CAPS: 50000.0000 [IU] | ORAL_CAPSULE | ORAL | Status: DC

## 2015-05-15 NOTE — Progress Notes (Signed)
Subjective:    Patient ID: Heather Gill, female    DOB: [birth year], PCP Asencion Noble, MD   Past Medical History  Diagnosis Date  . Glaucoma   . Anxiety   . Depression   . Hypothyroidism   . Blood in urine     Dr Maryland Pink told her 6 yrs ago-no problems  . Hx of spinal fusion   . Hormone replacement therapy (HRT)   . Urinary incontinence 02/23/2013  . Thyroid nodule   . Osteopenia   . Hypothyroid 01/22/2015   Past Surgical History  Procedure Laterality Date  . Tubal ligation    . Diagnostic laparoscopy      dx  . Back surgery      3 surg-last with fusion and rods  . Colonoscopy N/A 03/16/2012    Procedure: COLONOSCOPY;  Surgeon: Rogene Houston, MD;  Location: [facility];  Service: Endoscopy;  Laterality: N/A;  730  . Back surgery     Social History   Social History  . Marital Status: Married    Spouse Name: Gwyndolyn Saxon  . Number of Children: 2  . Years of Education: BA   Occupational History  .  Other    disabled   Social History Main Topics  . Smoking status: Never Smoker   . Smokeless tobacco: Never Used  . Alcohol Use: 2.4 oz/week    4 Glasses of wine per week     Comment: wine 2-3 glasses 2-3 days out of the week  . Drug Use: No  . Sexual Activity: Yes    Birth Control/ Protection: Post-menopausal, Surgical     Comment: tubal   Other Topics Concern  . None   Social History Narrative   Patient lives at home with spouse.   Caffeine Use: 3 Mt. Dew's 12oz. can daily   Outpatient Encounter Prescriptions as of 05/15/2015  Medication Sig  . diphenoxylate-atropine (LOMOTIL) 2.5-0.025 MG per tablet Take 1 tablet by mouth 4 (four) times daily as needed. Diarrhea  . HYDROcodone-acetaminophen (NORCO/VICODIN) 5-325 MG per tablet Take 1 tablet by mouth every 6 (six) hours as needed. Pain, usually takes a benadryl wit it to control itching.  . latanoprost (XALATAN) 0.005 % ophthalmic solution Place 1 drop into both eyes at bedtime.  Marland Kitchen LORazepam (ATIVAN) 0.5 MG  tablet Take 0.5 mg by mouth 3 (three) times daily as needed. Anxiety  . meloxicam (MOBIC) 7.5 MG tablet Take 7.5 mg by mouth as needed.   . promethazine (PHENERGAN) 25 MG tablet Take 25 mg by mouth every 6 (six) hours as needed. Nausea and Vomiting  . traZODone (DESYREL) 100 MG tablet Take 100 mg by mouth at bedtime.  . valACYclovir (VALTREX) 1000 MG tablet Take 2 today an 2 in am for cold sore prn  . venlafaxine XR (EFFEXOR-XR) 75 MG 24 hr capsule 75 mg daily.   . Vitamin D, Ergocalciferol, (DRISDOL) 50000 units CAPS capsule Take 1 capsule (50,000 Units total) by mouth every 7 (seven) days.  Marland Kitchen zolpidem (AMBIEN) 5 MG tablet Take 5 mg by mouth at bedtime as needed for sleep.   No facility-administered encounter medications on file as of 05/15/2015.   ALLERGIES: Allergies  Allergen Reactions  . Demerol [Meperidine] Itching  . Lortab [Hydrocodone-Acetaminophen] Itching  . Morphine And Related Itching  . Percocet [Oxycodone-Acetaminophen] Itching   VACCINATION STATUS:  There is no immunization history on file for this patient.  HPI  60 year old female patient with medical history as above. She is Returning  for follow-up with repeat full profile thyroid function tests. She has  history of hypothyroidism. She is not currently on any thyroid hormone replacement.   - During her last visit, she brought a medical history package showing that she did have thyroiditis in the late 90s with slightly above target TSH for which she was briefly treated with Synthroid but which was stopped several years ago. -Her repeat thyroid function tests are within normal range.  She denies heat or cold intolerance. She denies neck swelling currently. She did have thyroid ultrasound in 2010 which showed right lobe of thyroid was 4.3 cm and left lobe was 3.6 cm with no discrete nodules. -She gives some progressive weight loss related to her GI problem for which she is on close follow up with GI services. -She was  also diagnosed with osteopenia in the past ,  and on her repeat DEXA scan since her last visit.  She gives a remote history of stress fracture.   Review of Systems  Constitutional:  no new complaints, no subjective hyperthermia/hypothermia Eyes: no blurry vision, no xerophthalmia ENT: no sore throat, no nodules palpated in throat, no dysphagia/odynophagia, no hoarseness Cardiovascular: no CP/SOB/palpitations/leg swelling Respiratory: no cough/SOB Gastrointestinal: no N/V/D/C Musculoskeletal: no muscle/joint aches Skin: no rashes Neurological: no tremors/numbness/tingling/dizziness Psychiatric: no depression/anxiety  Objective:    BP 122/84 mmHg  Pulse 86  Ht 5' 6.25" (1.683 m)  Wt 184 lb (83.462 kg)  BMI 29.47 kg/m2  SpO2 100%  Wt Readings from Last 3 Encounters:  05/15/15 184 lb (83.462 kg)  04/03/15 185 lb (83.915 kg)  01/22/15 185 lb (83.915 kg)    Physical Exam   Constitutional: overweight, in NAD Eyes: PERRLA, EOMI, no exophthalmos ENT: moist mucous membranes, no thyromegaly, no cervical lymphadenopathy Cardiovascular: RRR, No MRG Respiratory: CTA B Gastrointestinal: abdomen soft, NT, ND, BS+ Musculoskeletal: no deformities, strength intact in all 4 Skin: moist, warm, no rashes Neurological: no tremor with outstretched hands, DTR normal in all 4   Results for YELISSA, AVELLO (MRN [id number]) as of 05/15/2015 16:02  Ref. Range 04/03/2015 15:10  TSH Latest Units: mIU/L 3.07  T4,Free(Direct) Latest Ref Range: 0.8-1.8 ng/dL 1.0  Thyroglobulin Ab Latest Ref Range: <2 IU/mL <1  Thyroperoxidase Ab SerPl-aCnc Latest Ref Range: <9 IU/mL <1     March 2,2016:  total cholesterol 301, triglycerides 263, HDL 88, LDL 160 05/02/2013 :TSH 5.56, total T4 7 0.4, T3 uptake 28.9, free thyroxine index 2.1  bone density study in 04/19/2013 keep T score was -2.2- showing 20-25% below Average bone mass. 05/03/2012 TSH was elevated at 5.04 along with total T4 of 6.9. -Complete lab studies  to be scanned into her records.  Assessment & Plan:   1.  Subclinical Hypothyroidism - Her repeat thyroid function tests are within normal range. She would not need any intervention for thyroid dysfunction at this time. She would need repeat thyroid function test in 6 months with office visit. - Based on a 2010 thyroid ultrasound she does not have nodular lesions, however it may be necessary to repeat thyroid ultrasound  in 1 year.  2. Osteopenia:  Her most recent DEXA scan and March 2017 showed:  DualFemur Neck Left 05/01/2015 [age] Osteopenia -1.1 0.890 g/cm2 Left Forearm Radius 33% 05/01/2015 57.2 Normal -1.0 0.645 g/cm2  - Although this is not possible to compare with her prior takes her report  (Her T score was -2.2 in March 2015) , this puts her in the osteopenia category. No clear evidence of  trivial fracture. She will not need bisphosphonate therapy for now.  3.vitamin D deficiency: She will be initiated on vitamin D 50,000 units weekly for 4-6 months time.  - I advised patient to maintain close follow up with Asencion Noble, MD for primary care needs. Follow up plan: Return in about 6 months (around 11/14/2015) for follow up with pre-visit labs, Vitamin D deficiency.  Glade Lloyd, MD Phone: [phone]  Fax: [phone]   05/15/2015, 4:00 PM

## 2015-05-20 ENCOUNTER — Ambulatory Visit (HOSPITAL_COMMUNITY)
Admission: RE | Admit: 2015-05-20 | Discharge: 2015-05-20 | Disposition: A | Discharge status: Home / Self Care | Payer: Medicare Other | Source: Ambulatory Visit | Attending: Internal Medicine | Admitting: Internal Medicine

## 2015-05-20 ENCOUNTER — Other Ambulatory Visit (HOSPITAL_COMMUNITY): Payer: Self-pay | Admitting: Internal Medicine

## 2015-05-20 DIAGNOSIS — M79605 Pain in left leg: Secondary | ICD-10-CM

## 2015-05-20 DIAGNOSIS — M79662 Pain in left lower leg: Secondary | ICD-10-CM | POA: Diagnosis not present

## 2015-06-12 DIAGNOSIS — M25851 Other specified joint disorders, right hip: Secondary | ICD-10-CM | POA: Diagnosis not present

## 2015-06-12 DIAGNOSIS — M47816 Spondylosis without myelopathy or radiculopathy, lumbar region: Secondary | ICD-10-CM | POA: Diagnosis not present

## 2015-06-14 ENCOUNTER — Telehealth: Payer: Self-pay | Admitting: Adult Health

## 2015-06-14 MED ORDER — SULFAMETHOXAZOLE-TRIMETHOPRIM 800-160 MG PO TABS: 1.0000 | ORAL_TABLET | Freq: Two times a day (BID) | ORAL | Status: DC

## 2015-06-14 NOTE — Telephone Encounter (Signed)
Has UTI symptoms, increase water and cranberry juice, will rx septra ds 1 bid x 7 days, if not better call

## 2015-10-30 DIAGNOSIS — M4302 Spondylolysis, cervical region: Secondary | ICD-10-CM | POA: Diagnosis not present

## 2015-10-30 DIAGNOSIS — M47816 Spondylosis without myelopathy or radiculopathy, lumbar region: Secondary | ICD-10-CM | POA: Diagnosis not present

## 2015-11-14 ENCOUNTER — Ambulatory Visit: Payer: Medicare Other | Admitting: "Endocrinology

## 2015-11-19 ENCOUNTER — Other Ambulatory Visit: Payer: Self-pay

## 2015-11-19 DIAGNOSIS — E039 Hypothyroidism, unspecified: Secondary | ICD-10-CM | POA: Diagnosis not present

## 2015-11-20 LAB — T4, FREE: FREE T4: 0.9 ng/dL (ref 0.8–1.8)

## 2015-11-20 LAB — TSH: TSH: 3.47 mIU/L

## 2015-11-26 ENCOUNTER — Encounter: Payer: Self-pay | Admitting: "Endocrinology

## 2015-11-26 ENCOUNTER — Ambulatory Visit (INDEPENDENT_AMBULATORY_CARE_PROVIDER_SITE_OTHER): Payer: Medicare Other | Admitting: "Endocrinology

## 2015-11-26 VITALS — BP 133/82 | HR 81 | Ht 66.25 in | Wt 191.0 lb

## 2015-11-26 DIAGNOSIS — M8580 Other specified disorders of bone density and structure, unspecified site: Secondary | ICD-10-CM

## 2015-11-26 DIAGNOSIS — E038 Other specified hypothyroidism: Secondary | ICD-10-CM | POA: Diagnosis not present

## 2015-11-26 DIAGNOSIS — E559 Vitamin D deficiency, unspecified: Secondary | ICD-10-CM

## 2015-11-26 MED ORDER — LEVOTHYROXINE SODIUM 50 MCG PO TABS: 50.0000 ug | ORAL_TABLET | Freq: Every day | ORAL | 3 refills | Status: DC

## 2015-11-26 MED ORDER — VITAMIN D3 125 MCG (5000 UT) PO CAPS: 5000.0000 [IU] | ORAL_CAPSULE | Freq: Every day | ORAL | 0 refills | Status: DC

## 2015-11-26 NOTE — Progress Notes (Signed)
Subjective:    Patient ID: Heather Gill, female    DOB: 1955/11/02, PCP Asencion Noble, MD   Past Medical History:  Diagnosis Date  . Anxiety   . Blood in urine    Dr Maryland Pink told her 6 yrs ago-no problems  . Depression   . Glaucoma   . Hormone replacement therapy (HRT)   . Hx of spinal fusion   . Hypothyroid 01/22/2015  . Hypothyroidism   . Osteopenia   . Thyroid nodule   . Urinary incontinence 02/23/2013   Past Surgical History:  Procedure Laterality Date  . BACK SURGERY     3 surg-last with fusion and rods  . BACK SURGERY    . COLONOSCOPY N/A 03/16/2012   Procedure: COLONOSCOPY;  Surgeon: Rogene Houston, MD;  Location: [facility];  Service: Endoscopy;  Laterality: N/A;  730  . DIAGNOSTIC LAPAROSCOPY     dx  . TUBAL LIGATION     Social History   Social History  . Marital status: Married    Spouse name: Gwyndolyn Saxon  . Number of children: 2  . Years of education: BA   Occupational History  .  Other    disabled   Social History Main Topics  . Smoking status: Never Smoker  . Smokeless tobacco: Never Used  . Alcohol use 2.4 oz/week    4 Glasses of wine per week     Comment: wine 2-3 glasses 2-3 days out of the week  . Drug use: No  . Sexual activity: Yes    Birth control/ protection: Post-menopausal, Surgical     Comment: tubal   Other Topics Concern  . None   Social History Narrative   Patient lives at home with spouse.   Caffeine Use: 3 Mt. Dew's 12oz. can daily   Outpatient Encounter Prescriptions as of 11/26/2015  Medication Sig  . Cholecalciferol (VITAMIN D3) 5000 units CAPS Take 1 capsule (5,000 Units total) by mouth daily.  . diphenoxylate-atropine (LOMOTIL) 2.5-0.025 MG per tablet Take 1 tablet by mouth 4 (four) times daily as needed. Diarrhea  . HYDROcodone-acetaminophen (NORCO/VICODIN) 5-325 MG per tablet Take 1 tablet by mouth every 6 (six) hours as needed. Pain, usually takes a benadryl wit it to control itching.  . latanoprost (XALATAN)  0.005 % ophthalmic solution Place 1 drop into both eyes at bedtime.  Marland Kitchen levothyroxine (SYNTHROID) 50 MCG tablet Take 1 tablet (50 mcg total) by mouth daily before breakfast.  . LORazepam (ATIVAN) 0.5 MG tablet Take 0.5 mg by mouth 3 (three) times daily as needed. Anxiety  . meloxicam (MOBIC) 7.5 MG tablet Take 7.5 mg by mouth as needed.   . promethazine (PHENERGAN) 25 MG tablet Take 25 mg by mouth every 6 (six) hours as needed. Nausea and Vomiting  . sulfamethoxazole-trimethoprim (BACTRIM DS,SEPTRA DS) 800-160 MG tablet Take 1 tablet by mouth 2 (two) times daily.  . traZODone (DESYREL) 100 MG tablet Take 100 mg by mouth at bedtime.  . valACYclovir (VALTREX) 1000 MG tablet Take 2 today an 2 in am for cold sore prn  . venlafaxine XR (EFFEXOR-XR) 75 MG 24 hr capsule 75 mg daily.   Marland Kitchen zolpidem (AMBIEN) 5 MG tablet Take 5 mg by mouth at bedtime as needed for sleep.  . [DISCONTINUED] Vitamin D, Ergocalciferol, (DRISDOL) 50000 units CAPS capsule Take 1 capsule (50,000 Units total) by mouth every 7 (seven) days.   No facility-administered encounter medications on file as of 11/26/2015.    ALLERGIES: Allergies  Allergen Reactions  .  Demerol [Meperidine] Itching  . Lortab [Hydrocodone-Acetaminophen] Itching  . Morphine And Related Itching  . Percocet [Oxycodone-Acetaminophen] Itching   VACCINATION STATUS:  There is no immunization history on file for this patient.  HPI  60 year old female patient with medical history as above. She is Returning for follow-up with repeat full profile thyroid function tests. She has  history of hypothyroidism. She is not currently on any thyroid hormone replacement.   -  She did have thyroid ultrasound in 2010 which showed right lobe of thyroid was 4.3 cm and left lobe was 3.6 cm with no discrete nodules. -She gives some progressive weight gain .  -She was also diagnosed with osteopenia in the past ,  and on her repeat DEXA scan since her last visit.  She gives a  remote history of stress fracture.   Review of Systems  Constitutional:  + gaining weight, no subjective hyperthermia/hypothermia Eyes: no blurry vision, no xerophthalmia ENT: no sore throat, no nodules palpated in throat, no dysphagia/odynophagia, no hoarseness Cardiovascular: no CP/SOB/palpitations/leg swelling Respiratory: no cough/SOB Gastrointestinal: no N/V/D/C Musculoskeletal: no muscle/joint aches Skin: no rashes Neurological: no tremors/numbness/tingling/dizziness Psychiatric: no depression/anxiety  Objective:    BP 133/82   Pulse 81   Ht 5' 6.25" (1.683 m)   Wt 191 lb (86.6 kg)   BMI 30.60 kg/m   Wt Readings from Last 3 Encounters:  11/26/15 191 lb (86.6 kg)  05/15/15 184 lb (83.5 kg)  04/03/15 185 lb (83.9 kg)    Physical Exam   Constitutional: overweight, in NAD Eyes: PERRLA, EOMI, no exophthalmos ENT: moist mucous membranes, no thyromegaly, no cervical lymphadenopathy Cardiovascular: RRR, No MRG Respiratory: CTA B Gastrointestinal: abdomen soft, NT, ND, BS+ Musculoskeletal: no deformities, strength intact in all 4 Skin: moist, warm, no rashes Neurological: no tremor with outstretched hands, DTR normal in all 4   Recent Results (from the past 2160 hour(s))  TSH     Status: None   Collection Time: 11/19/15 11:30 AM  Result Value Ref Range   TSH 3.47 mIU/L    Comment:   Reference Range   > or = 20 Years  0.40-4.50   Pregnancy Range First trimester  0.26-2.66 Second trimester 0.55-2.73 Third trimester  0.43-2.91     T4, free     Status: None   Collection Time: 11/19/15  2:40 PM  Result Value Ref Range   Free T4 0.9 0.8 - 1.8 ng/dL     Assessment & Plan:   1.  Subclinical Hypothyroidism - Her repeat thyroid function tests And her clinical presentation are Indicative of mild hypothyroidism. - She will benefit from partial supplement with levothyroxine. I will initiate 50 g of levothyroxine by mouth every morning.  - We discussed about  correct intake of levothyroxine, at fasting, with water, separated by at least 30 minutes from breakfast, and separated by more than 4 hours from calcium, iron, multivitamins, acid reflux medications (PPIs). -Patient is made aware of the fact that thyroid hormone replacement is needed for life, dose to be adjusted by periodic monitoring of thyroid function tests.   - Based on a 2010 thyroid ultrasound she does not have nodular lesions, however it may be necessary to repeat thyroid ultrasound  in 1 year.  2. Osteopenia:  Her most recent DEXA scan and March 2017 showed:  DualFemur Neck Left 05/01/2015 57.2 Osteopenia -1.1 0.890 g/cm2 Left Forearm Radius 33% 05/01/2015 57.2 Normal -1.0 0.645 g/cm2  - Although this is not possible to compare with her prior DEXA  report  (Her T score was -2.2 in March 2015) , this puts her in the osteopenia category. No clear evidence of trivial fracture. She will not need bisphosphonate therapy for now. I advised her to continue vitamin D 5000 units daily throughout the next 2 seasons.  3.vitamin D deficiency: She  is a status post therapy with vitamin D 50,000 units for 8 weeks,  will be initiated on vitamin D 5,000 units daily.  - I advised patient to maintain close follow up with Asencion Noble, MD for primary care needs. Follow up plan: Return in about 3 months (around 02/26/2016) for follow up with pre-visit labs.  Glade Lloyd, MD Phone: [phone]  Fax: [phone]   11/26/2015, 2:27 PM

## 2015-12-30 ENCOUNTER — Other Ambulatory Visit: Payer: Self-pay | Admitting: Adult Health

## 2016-02-25 ENCOUNTER — Telehealth: Payer: Self-pay | Admitting: Adult Health

## 2016-02-25 ENCOUNTER — Other Ambulatory Visit: Payer: Self-pay | Admitting: "Endocrinology

## 2016-02-25 DIAGNOSIS — E559 Vitamin D deficiency, unspecified: Secondary | ICD-10-CM | POA: Diagnosis not present

## 2016-02-25 DIAGNOSIS — R531 Weakness: Secondary | ICD-10-CM | POA: Diagnosis not present

## 2016-02-25 DIAGNOSIS — E038 Other specified hypothyroidism: Secondary | ICD-10-CM | POA: Diagnosis not present

## 2016-02-25 DIAGNOSIS — Z131 Encounter for screening for diabetes mellitus: Secondary | ICD-10-CM | POA: Diagnosis not present

## 2016-02-25 NOTE — Telephone Encounter (Signed)
Pt called has had 2 episodes of feeling weak, almost faint, will check CBC,CMP and A1c, has appt to get Thyroid checked today

## 2016-02-26 LAB — HEMOGLOBIN A1C
Hgb A1c MFr Bld: 5.3 % (ref <5.7)
Mean Plasma Glucose: 105 mg/dL

## 2016-02-26 LAB — CBC
HCT: 42.5 % (ref 35.0–45.0)
Hemoglobin: 14.1 g/dL (ref 11.7–15.5)
MCH: 30.7 pg (ref 27.0–33.0)
MCHC: 33.2 g/dL (ref 32.0–36.0)
MCV: 92.4 fL (ref 80.0–100.0)
MPV: 11.3 fL (ref 7.5–12.5)
PLATELETS: 301 10*3/uL (ref 140–400)
RBC: 4.6 MIL/uL (ref 3.80–5.10)
RDW: 15.3 % — ABNORMAL HIGH (ref 11.0–15.0)
WBC: 5.3 10*3/uL (ref 3.8–10.8)

## 2016-02-26 LAB — COMPREHENSIVE METABOLIC PANEL
ALT: 48 U/L — ABNORMAL HIGH (ref 6–29)
AST: 34 U/L (ref 10–35)
Albumin: 4.3 g/dL (ref 3.6–5.1)
Alkaline Phosphatase: 38 U/L (ref 33–130)
BUN: 11 mg/dL (ref 7–25)
CHLORIDE: 103 mmol/L (ref 98–110)
CO2: 27 mmol/L (ref 20–31)
CREATININE: 0.98 mg/dL (ref 0.50–1.05)
Calcium: 9.5 mg/dL (ref 8.6–10.4)
Glucose, Bld: 100 mg/dL — ABNORMAL HIGH (ref 65–99)
POTASSIUM: 4.6 mmol/L (ref 3.5–5.3)
SODIUM: 139 mmol/L (ref 135–146)
TOTAL PROTEIN: 6.6 g/dL (ref 6.1–8.1)
Total Bilirubin: 0.3 mg/dL (ref 0.2–1.2)

## 2016-02-26 LAB — VITAMIN D 25 HYDROXY (VIT D DEFICIENCY, FRACTURES): VIT D 25 HYDROXY: 36 ng/mL (ref 30–100)

## 2016-02-26 LAB — T4, FREE: FREE T4: 1.1 ng/dL (ref 0.8–1.8)

## 2016-02-26 LAB — TSH: TSH: 1.78 mIU/L

## 2016-02-28 ENCOUNTER — Telehealth: Payer: Self-pay | Admitting: Adult Health

## 2016-02-28 NOTE — Telephone Encounter (Signed)
Left message about labs and to watch tylenol and alcohol liver enzymes slightly elevated, and A1 c good

## 2016-03-03 ENCOUNTER — Ambulatory Visit: Payer: Medicare Other | Admitting: "Endocrinology

## 2016-03-31 ENCOUNTER — Ambulatory Visit (INDEPENDENT_AMBULATORY_CARE_PROVIDER_SITE_OTHER): Payer: Medicare Other | Admitting: "Endocrinology

## 2016-03-31 ENCOUNTER — Encounter: Payer: Self-pay | Admitting: "Endocrinology

## 2016-03-31 VITALS — BP 119/83 | HR 100 | Ht 66.25 in | Wt 195.0 lb

## 2016-03-31 DIAGNOSIS — M8580 Other specified disorders of bone density and structure, unspecified site: Secondary | ICD-10-CM

## 2016-03-31 DIAGNOSIS — E559 Vitamin D deficiency, unspecified: Secondary | ICD-10-CM | POA: Diagnosis not present

## 2016-03-31 DIAGNOSIS — E038 Other specified hypothyroidism: Secondary | ICD-10-CM

## 2016-03-31 NOTE — Progress Notes (Signed)
Subjective:    Patient ID: Heather Gill, female    DOB: 10-03-1955, PCP Asencion Noble, MD   Past Medical History:  Diagnosis Date  . Anxiety   . Blood in urine    Dr [place] Pink told her 6 yrs ago-no problems  . Depression   . Glaucoma   . Hormone replacement therapy (HRT)   . Hx of spinal fusion   . Hypothyroid 01/22/2015  . Hypothyroidism   . Osteopenia   . Thyroid nodule   . Urinary incontinence 02/23/2013   Past Surgical History:  Procedure Laterality Date  . BACK SURGERY     3 surg-last with fusion and rods  . BACK SURGERY    . COLONOSCOPY N/A 03/16/2012   Procedure: COLONOSCOPY;  Surgeon: Rogene Houston, MD;  [facility];  Service: Endoscopy;  Laterality: N/A;  730  . DIAGNOSTIC LAPAROSCOPY     dx  . TUBAL LIGATION     Social History   Social History  . Marital status: Married    Spouse name: Gwyndolyn Saxon  . Number of children: 2  . Years of education: BA   Occupational History  .  Other    disabled   Social History Main Topics  . Smoking status: Never Smoker  . Smokeless tobacco: Never Used  . Alcohol use 2.4 oz/week    4 Glasses of wine per week     Comment: wine 2-3 glasses 2-3 days out of the week  . Drug use: No  . Sexual activity: Yes    Birth control/ protection: Post-menopausal, Surgical     Comment: tubal   Other Topics Concern  . None   Social History Narrative   Patient lives at home with spouse.   Caffeine Use: 3 Mt. Dew's 12oz. can daily   Outpatient Encounter Prescriptions as of 03/31/2016  Medication Sig  . Cholecalciferol (VITAMIN D3) 5000 units CAPS Take 1 capsule (5,000 Units total) by mouth daily.  . diphenoxylate-atropine (LOMOTIL) 2.5-0.025 MG per tablet Take 1 tablet by mouth 4 (four) times daily as needed. Diarrhea  . HYDROcodone-acetaminophen (NORCO/VICODIN) 5-325 MG per tablet Take 1 tablet by mouth every 6 (six) hours as needed. Pain, usually takes a benadryl wit it to control itching.  . latanoprost (XALATAN) 0.005  % ophthalmic solution Place 1 drop into both eyes at bedtime.  Marland Kitchen levothyroxine (SYNTHROID) 50 MCG tablet Take 1 tablet (50 mcg total) by mouth daily before breakfast.  . LORazepam (ATIVAN) 0.5 MG tablet Take 0.5 mg by mouth 3 (three) times daily as needed. Anxiety  . meloxicam (MOBIC) 7.5 MG tablet Take 7.5 mg by mouth as needed.   . promethazine (PHENERGAN) 25 MG tablet Take 25 mg by mouth every 6 (six) hours as needed. Nausea and Vomiting  . sulfamethoxazole-trimethoprim (BACTRIM DS,SEPTRA DS) 800-160 MG tablet Take 1 tablet by mouth 2 (two) times daily.  . traZODone (DESYREL) 100 MG tablet Take 100 mg by mouth at bedtime.  . valACYclovir (VALTREX) 1000 MG tablet TAKE 2 TABLETS TODAY, AND 2 TABLETS IN THE MORNING FOR COLD SORE AS NEEDED.  Marland Kitchen venlafaxine XR (EFFEXOR-XR) 75 MG 24 hr capsule 75 mg daily.   Marland Kitchen zolpidem (AMBIEN) 5 MG tablet Take 5 mg by mouth at bedtime as needed for sleep.   No facility-administered encounter medications on file as of 03/31/2016.    ALLERGIES: Allergies  Allergen Reactions  . Demerol [Meperidine] Itching  . Lortab [Hydrocodone-Acetaminophen] Itching  . Morphine And Related Itching  . Percocet [  Oxycodone-Acetaminophen] Itching   VACCINATION STATUS:  There is no immunization history on file for this patient.  HPI  61 year old female patient with medical history as above. She is Returning for follow-up with repeat full profile thyroid function tests. She has  history of hypothyroidism. She is on levothyroxine 50 g by mouth every morning.   -  She did have thyroid ultrasound in 2010 which showed right lobe of thyroid was 4.3 cm and left lobe was 3.6 cm with no discrete nodules. -She gives some progressive weight gain .  -She was also diagnosed with osteopenia in the past ,  and on her repeat DEXA scan since her last visit.  She gives a remote history of stress fracture.   Review of Systems  Constitutional:  + gaining weight, no subjective  hyperthermia/hypothermia Eyes: no blurry vision, no xerophthalmia ENT: no sore throat, no nodules palpated in throat, no dysphagia/odynophagia, no hoarseness Cardiovascular: no CP/SOB/palpitations/leg swelling Respiratory: no cough/SOB Gastrointestinal: no N/V/D/C Musculoskeletal: no muscle/joint aches Skin: no rashes Neurological: no tremors/numbness/tingling/dizziness Psychiatric: no depression/anxiety  Objective:    BP 119/83   Pulse 100   Ht 5' 6.25" (1.683 m)   Wt 195 lb (88.5 kg)   BMI 31.24 kg/m   Wt Readings from Last 3 Encounters:  03/31/16 195 lb (88.5 kg)  11/26/15 191 lb (86.6 kg)  05/15/15 184 lb (83.5 kg)    Physical Exam   Constitutional: overweight, in NAD Eyes: PERRLA, EOMI, no exophthalmos ENT: moist mucous membranes, no thyromegaly, no cervical lymphadenopathy Cardiovascular: RRR, No MRG Respiratory: CTA B Gastrointestinal: abdomen soft, NT, ND, BS+ Musculoskeletal: no deformities, strength intact in all 4 Skin: moist, warm, no rashes Neurological: no tremor with outstretched hands, DTR normal in all 4   Recent Results (from the past 2160 hour(s))  CBC     Status: Abnormal   Collection Time: 02/25/16  2:14 PM  Result Value Ref Range   WBC 5.3 3.8 - 10.8 K/uL   RBC 4.60 3.80 - 5.10 MIL/uL   Hemoglobin 14.1 11.7 - 15.5 g/dL   HCT 42.5 35.0 - 45.0 %   MCV 92.4 80.0 - 100.0 fL   MCH 30.7 27.0 - 33.0 pg   MCHC 33.2 32.0 - 36.0 g/dL   RDW 15.3 (H) 11.0 - 15.0 %   Platelets 301 140 - 400 K/uL   MPV 11.3 7.5 - 12.5 fL  Comprehensive metabolic panel     Status: Abnormal   Collection Time: 02/25/16  2:14 PM  Result Value Ref Range   Sodium 139 135 - 146 mmol/L   Potassium 4.6 3.5 - 5.3 mmol/L   Chloride 103 98 - 110 mmol/L   CO2 27 20 - 31 mmol/L   Glucose, Bld 100 (H) 65 - 99 mg/dL   BUN 11 7 - 25 mg/dL   Creat 0.98 0.50 - 1.05 mg/dL    Comment:   For patients > or = 59 years of age: The upper reference limit for Creatinine is approximately 13%  higher for people identified as African-American.      Total Bilirubin 0.3 0.2 - 1.2 mg/dL   Alkaline Phosphatase 38 33 - 130 U/L   AST 34 10 - 35 U/L   ALT 48 (H) 6 - 29 U/L   Total Protein 6.6 6.1 - 8.1 g/dL   Albumin 4.3 3.6 - 5.1 g/dL   Calcium 9.5 8.6 - 10.4 mg/dL  Hemoglobin A1c     Status: None   Collection Time: 02/25/16  2:14 PM  Result Value Ref Range   Hgb A1c MFr Bld 5.3 <5.7 %    Comment:   For the purpose of screening for the presence of diabetes:   <5.7%       Consistent with the absence of diabetes 5.7-6.4 %   Consistent with increased risk for diabetes (prediabetes) >=6.5 %     Consistent with diabetes   This assay result is consistent with a decreased risk of diabetes.   Currently, no consensus exists regarding use of hemoglobin A1c for diagnosis of diabetes in children.   According to American Diabetes Association (ADA) guidelines, hemoglobin A1c <7.0% represents optimal control in non-pregnant diabetic patients. Different metrics may apply to specific patient populations. Standards of Medical Care in Diabetes (ADA).      Mean Plasma Glucose 105 mg/dL  TSH     Status: None   Collection Time: 02/25/16  2:17 PM  Result Value Ref Range   TSH 1.78 mIU/L    Comment:   Reference Range   > or = 20 Years  0.40-4.50   Pregnancy Range First trimester  0.26-2.66 Second trimester 0.55-2.73 Third trimester  0.43-2.91     T4, free     Status: None   Collection Time: 02/25/16  2:17 PM  Result Value Ref Range   Free T4 1.1 0.8 - 1.8 ng/dL  VITAMIN D 25 Hydroxy (Vit-D Deficiency, Fractures)     Status: None   Collection Time: 02/25/16  2:17 PM  Result Value Ref Range   Vit D, 25-Hydroxy 36 30 - 100 ng/mL    Comment: Vitamin D Status           25-OH Vitamin D        Deficiency                <20 ng/mL        Insufficiency         20 - 29 ng/mL        Optimal             > or = 30 ng/mL   For 25-OH Vitamin D testing on patients on D2-supplementation  and patients for whom quantitation of D2 and D3 fractions is required, the QuestAssureD 25-OH VIT D, (D2,D3), LC/MS/MS is recommended: order code [id number] (patients > 2 yrs).      Assessment & Plan:   1.  Hypothyroidism - Her repeat thyroid function tests are suggestive of appropriate replacement with levothyroxine. - She will continue levothyroxine 50 g of levothyroxine by mouth every morning.  - We discussed about correct intake of levothyroxine, at fasting, with water, separated by at least 30 minutes from breakfast, and separated by more than 4 hours from calcium, iron, multivitamins, acid reflux medications (PPIs). -Patient is made aware of the fact that thyroid hormone replacement is needed for life, dose to be adjusted by periodic monitoring of thyroid function tests.   - Based on a 2010 thyroid ultrasound she does not have nodular lesions, however it may be necessary to repeat thyroid ultrasound before her next visit in 6 month.  2. Osteopenia:  Her most recent DEXA scan and March 2017 showed:  DualFemur Neck Left 05/01/2015 57.2 Osteopenia -1.1 0.890 g/cm2 Left Forearm Radius 33% 05/01/2015 57.2 Normal -1.0 0.645 g/cm2  - Although this is not possible to compare with her prior DEXA report  (Her T score was -2.2 in March 2015) , this puts her in the osteopenia category. No clear evidence of  trivial fracture. She will not need bisphosphonate therapy for now. I advised her to continue vitamin D 5000 units daily throughout the next 2 seasons.  3.Vitamin D deficiency: She  is a status post therapy with vitamin D 50,000 units for 8 weeks,  will be initiated on vitamin D 5,000 units daily.  - I advised patient to maintain close follow up with Asencion Noble, MD for primary care needs. Follow up plan: Return in about 6 months (around 09/28/2016) for Thyroid / Neck Ultrasound, follow up with pre-visit labs.  Glade Lloyd, MD Phone: [phone]  Fax: [phone]   03/31/2016, 11:25 AM

## 2016-04-02 ENCOUNTER — Other Ambulatory Visit: Payer: Self-pay | Admitting: "Endocrinology

## 2016-04-09 DIAGNOSIS — M4302 Spondylolysis, cervical region: Secondary | ICD-10-CM | POA: Diagnosis not present

## 2016-04-09 DIAGNOSIS — M47816 Spondylosis without myelopathy or radiculopathy, lumbar region: Secondary | ICD-10-CM | POA: Diagnosis not present

## 2016-05-06 ENCOUNTER — Other Ambulatory Visit: Payer: Self-pay | Admitting: Adult Health

## 2016-05-06 DIAGNOSIS — Z1231 Encounter for screening mammogram for malignant neoplasm of breast: Secondary | ICD-10-CM

## 2016-05-13 ENCOUNTER — Ambulatory Visit (HOSPITAL_COMMUNITY): Payer: Medicare Other

## 2016-07-13 DIAGNOSIS — I10 Essential (primary) hypertension: Secondary | ICD-10-CM | POA: Diagnosis not present

## 2016-07-14 DIAGNOSIS — M47816 Spondylosis without myelopathy or radiculopathy, lumbar region: Secondary | ICD-10-CM | POA: Diagnosis not present

## 2016-07-25 DIAGNOSIS — M25571 Pain in right ankle and joints of right foot: Secondary | ICD-10-CM | POA: Diagnosis not present

## 2016-07-29 ENCOUNTER — Other Ambulatory Visit: Payer: Self-pay | Admitting: "Endocrinology

## 2016-08-17 DIAGNOSIS — I10 Essential (primary) hypertension: Secondary | ICD-10-CM | POA: Diagnosis not present

## 2016-08-19 DIAGNOSIS — M25571 Pain in right ankle and joints of right foot: Secondary | ICD-10-CM | POA: Diagnosis not present

## 2016-09-09 DIAGNOSIS — M25571 Pain in right ankle and joints of right foot: Secondary | ICD-10-CM | POA: Diagnosis not present

## 2016-09-14 DIAGNOSIS — M25572 Pain in left ankle and joints of left foot: Secondary | ICD-10-CM | POA: Diagnosis not present

## 2016-09-15 ENCOUNTER — Ambulatory Visit (HOSPITAL_COMMUNITY)
Admission: RE | Admit: 2016-09-15 | Discharge: 2016-09-15 | Disposition: A | Discharge status: Home / Self Care | Payer: Medicare Other | Source: Ambulatory Visit | Attending: "Endocrinology | Admitting: "Endocrinology

## 2016-09-15 DIAGNOSIS — E041 Nontoxic single thyroid nodule: Secondary | ICD-10-CM | POA: Diagnosis not present

## 2016-09-15 DIAGNOSIS — E038 Other specified hypothyroidism: Secondary | ICD-10-CM

## 2016-09-18 DIAGNOSIS — M76822 Posterior tibial tendinitis, left leg: Secondary | ICD-10-CM | POA: Diagnosis not present

## 2016-09-30 ENCOUNTER — Ambulatory Visit: Payer: Medicare Other | Admitting: "Endocrinology

## 2016-10-22 ENCOUNTER — Ambulatory Visit: Payer: Medicare Other | Admitting: "Endocrinology

## 2016-11-12 ENCOUNTER — Other Ambulatory Visit: Payer: Self-pay | Admitting: "Endocrinology

## 2016-11-13 DIAGNOSIS — M76821 Posterior tibial tendinitis, right leg: Secondary | ICD-10-CM | POA: Diagnosis not present

## 2016-11-13 DIAGNOSIS — M6701 Short Achilles tendon (acquired), right ankle: Secondary | ICD-10-CM | POA: Diagnosis not present

## 2016-11-19 DIAGNOSIS — M47816 Spondylosis without myelopathy or radiculopathy, lumbar region: Secondary | ICD-10-CM | POA: Diagnosis not present

## 2016-11-23 ENCOUNTER — Ambulatory Visit (HOSPITAL_COMMUNITY)
Admission: RE | Admit: 2016-11-23 | Discharge: 2016-11-23 | Disposition: A | Discharge status: Home / Self Care | Payer: Medicare Other | Source: Ambulatory Visit | Attending: Adult Health | Admitting: Adult Health

## 2016-11-23 DIAGNOSIS — Z1231 Encounter for screening mammogram for malignant neoplasm of breast: Secondary | ICD-10-CM | POA: Insufficient documentation

## 2016-12-07 ENCOUNTER — Other Ambulatory Visit: Payer: Self-pay | Admitting: Adult Health

## 2016-12-08 DIAGNOSIS — G8918 Other acute postprocedural pain: Secondary | ICD-10-CM | POA: Diagnosis not present

## 2016-12-08 DIAGNOSIS — S93491A Sprain of other ligament of right ankle, initial encounter: Secondary | ICD-10-CM | POA: Diagnosis not present

## 2016-12-08 DIAGNOSIS — M62471 Contracture of muscle, right ankle and foot: Secondary | ICD-10-CM | POA: Diagnosis not present

## 2016-12-08 DIAGNOSIS — S93491S Sprain of other ligament of right ankle, sequela: Secondary | ICD-10-CM | POA: Diagnosis not present

## 2016-12-08 DIAGNOSIS — M216X1 Other acquired deformities of right foot: Secondary | ICD-10-CM | POA: Diagnosis not present

## 2016-12-08 DIAGNOSIS — M76821 Posterior tibial tendinitis, right leg: Secondary | ICD-10-CM | POA: Diagnosis not present

## 2016-12-08 DIAGNOSIS — M6701 Short Achilles tendon (acquired), right ankle: Secondary | ICD-10-CM | POA: Diagnosis not present

## 2016-12-18 ENCOUNTER — Other Ambulatory Visit: Payer: Self-pay

## 2016-12-18 MED ORDER — LEVOTHYROXINE SODIUM 50 MCG PO TABS: ORAL_TABLET | ORAL | 1 refills | Status: DC

## 2016-12-24 ENCOUNTER — Ambulatory Visit: Payer: Medicare Other | Admitting: "Endocrinology

## 2016-12-24 DIAGNOSIS — M76821 Posterior tibial tendinitis, right leg: Secondary | ICD-10-CM | POA: Diagnosis not present

## 2016-12-24 DIAGNOSIS — M6701 Short Achilles tendon (acquired), right ankle: Secondary | ICD-10-CM | POA: Diagnosis not present

## 2016-12-24 DIAGNOSIS — M216X1 Other acquired deformities of right foot: Secondary | ICD-10-CM | POA: Diagnosis not present

## 2017-01-22 DIAGNOSIS — M76821 Posterior tibial tendinitis, right leg: Secondary | ICD-10-CM | POA: Diagnosis not present

## 2017-01-22 DIAGNOSIS — M6701 Short Achilles tendon (acquired), right ankle: Secondary | ICD-10-CM | POA: Diagnosis not present

## 2017-02-26 DIAGNOSIS — M79671 Pain in right foot: Secondary | ICD-10-CM | POA: Diagnosis not present

## 2017-03-02 ENCOUNTER — Telehealth: Payer: Self-pay | Admitting: Adult Health

## 2017-03-02 DIAGNOSIS — Z01419 Encounter for gynecological examination (general) (routine) without abnormal findings: Secondary | ICD-10-CM

## 2017-03-02 DIAGNOSIS — I10 Essential (primary) hypertension: Secondary | ICD-10-CM

## 2017-03-02 DIAGNOSIS — Z13220 Encounter for screening for lipoid disorders: Secondary | ICD-10-CM

## 2017-03-02 DIAGNOSIS — Z131 Encounter for screening for diabetes mellitus: Secondary | ICD-10-CM

## 2017-03-02 DIAGNOSIS — Z1321 Encounter for screening for nutritional disorder: Secondary | ICD-10-CM

## 2017-03-02 DIAGNOSIS — E039 Hypothyroidism, unspecified: Secondary | ICD-10-CM

## 2017-03-02 NOTE — Telephone Encounter (Signed)
Patient called stating that she has an appointment for her pap and physical with jennifer on 2/11 and she would like to do blood work a week before her appointment so Anderson Malta could go over it. Please contact pt

## 2017-03-15 DIAGNOSIS — Z131 Encounter for screening for diabetes mellitus: Secondary | ICD-10-CM | POA: Diagnosis not present

## 2017-03-15 DIAGNOSIS — Z01419 Encounter for gynecological examination (general) (routine) without abnormal findings: Secondary | ICD-10-CM | POA: Diagnosis not present

## 2017-03-15 DIAGNOSIS — E039 Hypothyroidism, unspecified: Secondary | ICD-10-CM | POA: Diagnosis not present

## 2017-03-15 DIAGNOSIS — Z13220 Encounter for screening for lipoid disorders: Secondary | ICD-10-CM | POA: Diagnosis not present

## 2017-03-15 DIAGNOSIS — Z1321 Encounter for screening for nutritional disorder: Secondary | ICD-10-CM | POA: Diagnosis not present

## 2017-03-16 LAB — LIPID PANEL
CHOL/HDL RATIO: 4.7 ratio — AB (ref 0.0–4.4)
Cholesterol, Total: 261 mg/dL — ABNORMAL HIGH (ref 100–199)
HDL: 56 mg/dL (ref >39)
LDL CALC: 143 mg/dL — AB (ref 0–99)
Triglycerides: 311 mg/dL — ABNORMAL HIGH (ref 0–149)
VLDL CHOLESTEROL CAL: 62 mg/dL — AB (ref 5–40)

## 2017-03-16 LAB — CBC
HEMATOCRIT: 43.8 % (ref 34.0–46.6)
HEMOGLOBIN: 14.9 g/dL (ref 11.1–15.9)
MCH: 31.3 pg (ref 26.6–33.0)
MCHC: 34 g/dL (ref 31.5–35.7)
MCV: 92 fL (ref 79–97)
Platelets: 262 10*3/uL (ref 150–379)
RBC: 4.76 x10E6/uL (ref 3.77–5.28)
RDW: 14.5 % (ref 12.3–15.4)
WBC: 5.7 10*3/uL (ref 3.4–10.8)

## 2017-03-16 LAB — COMPREHENSIVE METABOLIC PANEL
A/G RATIO: 2.2 (ref 1.2–2.2)
ALBUMIN: 4.6 g/dL (ref 3.5–5.5)
ALT: 25 IU/L (ref 0–32)
AST: 21 IU/L (ref 0–40)
Alkaline Phosphatase: 57 IU/L (ref 39–117)
BUN / CREAT RATIO: 9 (ref 9–23)
BUN: 8 mg/dL (ref 6–24)
Bilirubin Total: <0.2 mg/dL (ref 0.0–1.2)
CO2: 25 mmol/L (ref 20–29)
Calcium: 9.6 mg/dL (ref 8.7–10.2)
Chloride: 99 mmol/L (ref 96–106)
Creatinine, Ser: 0.91 mg/dL (ref 0.57–1.00)
GFR, EST AFRICAN AMERICAN: 80 mL/min/{1.73_m2} (ref >59)
GFR, EST NON AFRICAN AMERICAN: 69 mL/min/{1.73_m2} (ref >59)
GLOBULIN, TOTAL: 2.1 g/dL (ref 1.5–4.5)
Glucose: 95 mg/dL (ref 65–99)
Potassium: 4.7 mmol/L (ref 3.5–5.2)
SODIUM: 140 mmol/L (ref 134–144)
TOTAL PROTEIN: 6.7 g/dL (ref 6.0–8.5)

## 2017-03-16 LAB — HEMOGLOBIN A1C
ESTIMATED AVERAGE GLUCOSE: 117 mg/dL
HEMOGLOBIN A1C: 5.7 % — AB (ref 4.8–5.6)

## 2017-03-16 LAB — VITAMIN D 25 HYDROXY (VIT D DEFICIENCY, FRACTURES): VIT D 25 HYDROXY: 26.7 ng/mL — AB (ref 30.0–100.0)

## 2017-03-16 LAB — T4, FREE: Free T4: 0.78 ng/dL — ABNORMAL LOW (ref 0.82–1.77)

## 2017-03-16 LAB — TSH: TSH: 2.71 u[IU]/mL (ref 0.450–4.500)

## 2017-03-17 ENCOUNTER — Telehealth: Payer: Self-pay | Admitting: Adult Health

## 2017-03-17 NOTE — Telephone Encounter (Signed)
Pt aware of labs, decrease carbs and increase activity when able, take vitamin D more regularly, and will recheck labs in 6-12 months

## 2017-03-18 ENCOUNTER — Ambulatory Visit (HOSPITAL_COMMUNITY): Payer: Medicare Other | Attending: Student

## 2017-03-18 ENCOUNTER — Other Ambulatory Visit: Payer: Self-pay

## 2017-03-18 ENCOUNTER — Encounter (HOSPITAL_COMMUNITY): Payer: Self-pay

## 2017-03-18 DIAGNOSIS — M6281 Muscle weakness (generalized): Secondary | ICD-10-CM

## 2017-03-18 DIAGNOSIS — M25674 Stiffness of right foot, not elsewhere classified: Secondary | ICD-10-CM | POA: Diagnosis not present

## 2017-03-18 DIAGNOSIS — R2689 Other abnormalities of gait and mobility: Secondary | ICD-10-CM

## 2017-03-18 DIAGNOSIS — M25571 Pain in right ankle and joints of right foot: Secondary | ICD-10-CM

## 2017-03-18 NOTE — Patient Instructions (Signed)
   ANKLE ABC's: 2 times through alphabet 1-2 times per day  While in a seated position, write out the alphabet in the air with your big toe.  Your ankle should be moving as you perform this.     Towel Pick-up: 2 times 1 minute 1-2 times per day  Place a towel on the floor. Crunch your toes around the towel and attempt to pick it up with your toes.

## 2017-03-19 NOTE — Therapy (Deleted)
[facility] [place], [place], [address] Phone: [phone]   Fax:  [phone]  Physical Therapy Evaluation  Patient Details  Name: Heather Gill MRN: [id number] Date of Birth: 09/30/1955 Referring Provider: Corky Sing PA-C   Encounter Date: 03/18/2017  PT End of Session - 03/18/17 2152    Visit Number  1    Number of Visits  13    Date for PT Re-Evaluation  04/08/17    Authorization Type  [employer]    Authorization Time Period  03/18/17 - 04/29/17    PT Start Time  1302    PT Stop Time  1346    PT Time Calculation (min)  44 min    Activity Tolerance  Patient tolerated treatment well;No increased pain    Behavior During Therapy  WFL for tasks assessed/performed       Past Medical History:  Diagnosis Date  . Anxiety   . Blood in urine    Dr Maryland Pink told her 6 yrs ago-no problems  . Depression   . Glaucoma   . Hormone replacement therapy (HRT)   . Hx of spinal fusion   . Hypothyroid 01/22/2015  . Hypothyroidism   . Osteopenia   . Thyroid nodule   . Urinary incontinence 02/23/2013    Past Surgical History:  Procedure Laterality Date  . BACK SURGERY     3 surg-last with fusion and rods  . BACK SURGERY    . COLONOSCOPY N/A 03/16/2012   Procedure: COLONOSCOPY;  Surgeon: Rogene Houston, MD;  Location: [facility];  Service: Endoscopy;  Laterality: N/A;  730  . DIAGNOSTIC LAPAROSCOPY     dx  . TUBAL LIGATION      There were no vitals filed for this visit.       [facility] PT Assessment - 03/18/17 0001      Assessment   Medical Diagnosis  Right Heel Pain    Onset Date/Surgical Date  12/08/16 pain started back in June 2018    Next MD Visit  03/26/17    Prior Therapy  For neck and low back years ago      Precautions   Precautions  None    Precaution Comments  was NWB for 10 weeks initially after surgery and then put into walking boot      Restrictions   Weight Bearing Restrictions   No      [place] residence    Living Arrangements  Spouse/significant other 2 dogs    Available Help at Discharge  Family    Type of [place] to enter    Entrance Stairs-Number of Steps  2 none throughou garage    Entrance Stairs-Rails  None    Home Layout  Two level    Alternate Level Stairs-Number of Steps  12    Alternate Level Stairs-Rails  Right 30 lb pug      Prior Function   Level of Independence  Independent    Vocation  Retired;On disability    Leisure  reading, walking her dogs      Cognition   Overall Cognitive Status  Within Functional Limits for tasks assessed      Observation/Other Assessments   Focus on Therapeutic Outcomes (FOTO)   54% limited      Sensation   Additional Comments  Patient reports "fuzzy" sensation along plantar surface of right  foot from her metatarsal heads to her toes. She is not drivign as much currently becasue on one occassion her foot slipped off the pedal.      Functional Tests   Functional tests  Squat;Step down;Single leg stance      Squat   Comments  Decreased depth, decresaed hip hinge,genu varus bilatearlly, pes cavus bil, early heel rise bil      Step Down   Comments  Rt LE: unable to perform due to pain; Lt LE: perfomred 5 reps with external support for balance, knee valgus, early heel rise      Single Leg Stance   Comments  Rt LE: 0 sec.  Lt LE: 5 sec.      AROM   Right Ankle Dorsiflexion  5    Right Ankle Plantar Flexion  55    Right Ankle Inversion  35    Right Ankle Eversion  25    Left Ankle Dorsiflexion  10    Left Ankle Plantar Flexion  55    Left Ankle Inversion  35    Left Ankle Eversion  25      Strength   Right Hip Flexion  4/5    Right Hip Extension  3+/5    Right Hip ABduction  4/5    Left Hip Flexion  4/5    Left Hip Extension  3+/5    Left Hip ABduction  4/5    Right Knee Flexion  5/5    Right Knee Extension  5/5    Left Knee Flexion   5/5    Left Knee Extension  5/5    Right Ankle Dorsiflexion  5/5    Right Ankle Plantar Flexion  -- test next session for single limb heel raise    Right Ankle Inversion  4+/5 painful    Right Ankle Eversion  4+/5 painful    Left Ankle Dorsiflexion  5/5    Left Ankle Plantar Flexion  -- test next session for single limb heel raise    Left Ankle Inversion  5/5    Left Ankle Eversion  5/5      Special Tests    Special Tests  Ankle/Foot Special Tests;Foot Alignment    Ankle/Foot Special Tests   Provocative Tinel's Test;Great Toe Extension Test      Great Toe Extension Test    Comments  Great toe hypomobility and decreased windlass maneuvar in WB      Provocative Tinel's test    Findings  Negative      Subtalar Neutral (WB)   Comments  Patient in pes cavuse position bilaterally in WB, unable to sustain subtalar neutral position bil       Ambulation/Gait   Ambulation Distance (Feet)  446 Feet 3MWT    Gait Pattern  Step-through pattern;Decreased stance time - right;Decreased step length - left;Decreased dorsiflexion - right;Decreased weight shift to right;Trendelenburg;Antalgic    Gait velocity  0.73 m/s    Stairs  Yes    Stairs Assistance  7: Independent    Stair Management Technique  One rail Right;Step to pattern;Forwards slight side step to descend     Number of Stairs  4    Height of Stairs  6      Objective measurements completed on examination: See above findings.    [facility] Adult PT Treatment/Exercise - 03/19/17 0001      Exercises   Exercises  Ankle      Ankle Exercises: Seated   ABC's  1 rep  PT Education - 03/18/17 2201    Education provided  Yes    Education Details  Educated on exam findings and on initial HEP.    Person(s) Educated  Patient    Methods  Explanation;Handout    Comprehension  Verbalized understanding       PT Short Term Goals - 03/18/17 2228      PT SHORT TERM GOAL #1   Title  Patient will be independent with HEP and be able to  demosntrate proper form/technique with exercises to improve functional strength.    Time  3    Period  Weeks    Status  New    Target Date  04/08/17      PT SHORT TERM GOAL #2   Title  Patient will improve MMT by 1/2 grade for limited muscle groups and be pain free with resisted testing to improve functional performance and quality of gait and stair ambulation.    Time  3    Period  Weeks    Status  New      PT SHORT TERM GOAL #3   Title  Patient will improve Rt ankle dorsiflexion to 10 degrees or greater to achieve equal ROM compared to Lt ankle and improve functional drosiflexion for gait.    Time  3    Period  Weeks    Status  New        PT Long Term Goals - 03/19/17 2236      PT LONG TERM GOAL #1   Title  Patient will improve MMT by 1 grade for limited muscle groups and be pain free with resisted testing to improve functional performance and quality of gait and stair ambulation.    Time  6    Period  Weeks    Status  New    Target Date  04/29/17      PT LONG TERM GOAL #2   Title  Patient will perform SLS for 15 seconds Bil to ipmrove balance durign functional activities like gait and stair ambulation.    Time  6    Period  Weeks    Status  New      PT LONG TERM GOAL #3   Title  Patient will have effective windlass maneuver in WB and perform Bil squat with good depth, no excessive valgus, normal hip hinge, and no early heel rise to demonstrate improved mechanics for functional activites and improve ROM with ankle mobility as well as imrpvoved proximal hip control.    Time  6    Period  Weeks    Status  New        Plan - 03/19/17 2155    Clinical Impression Statement  Patient presents to initial physical therapy evaluation for right heel/foot pain following a surgery on 12/08/16 to improve her right medial arch. Current impairments include decresaed proximal hip strength, decreased balance, difficulty with gait, pain with functional mobility, decreased rigth ankle ROM,  and impaird ankle alignment/posture in WB bilaterally. She will benefit from skilled PT services to address impairments and progress towards goals to improve QOL and reduce pain.    History and Personal Factors relevant to plan of care:  MMT, ROM, 3MWT, clinicla judgement, FOTO    Clinical Presentation  Stable    Clinical Decision Making  Low    Rehab Potential  Good    PT Frequency  1x / week    PT Duration  6 weeks    PT Treatment/Interventions  ADLs/Self  Care Home Management;Cryotherapy;Electrical Stimulation;Moist Heat;Stair training;Functional mobility training;Therapeutic activities;Therapeutic exercise;Balance training;Neuromuscular re-education;Gait training;Patient/family education;Passive range of motion;Manual techniques;Scar mobilization;Energy conservation;Taping    PT Next Visit Plan  Review initial evaluation and goals. Initiate manual therpay forAP glide to talocrural joint. Begin BAPS, 4 way ankle, and heel raises. Initiate proximal hip strengthening.    PT Home Exercise Plan  Eval: ankle ABC's, towel crunch    Consulted and Agree with Plan of Care  Patient       Patient will benefit from skilled therapeutic intervention in order to improve the following deficits and impairments:  Abnormal gait, Decreased balance, Decreased endurance, Decreased mobility, Difficulty walking, Hypomobility, Decreased range of motion, Decreased activity tolerance, Decreased strength, Impaired flexibility, Pain  Visit Diagnosis: Pain in right ankle and joints of right foot  Stiffness of right foot, not elsewhere classified  Muscle weakness (generalized)  Other abnormalities of gait and mobility     Problem List Patient Active Problem List   Diagnosis Date Noted  . Vitamin D deficiency 05/15/2015  . Osteopenia 04/03/2015  . Hypothyroidism 01/22/2015  . Postmenopausal HRT (hormone replacement therapy) 02/23/2013  . Urinary incontinence 02/23/2013    Kipp Brood, PT,  DPT Physical Therapist with [facility]  03/19/2017 10:49 PM    [facility] [place], [place], [address] Phone: [phone]   Fax:  [phone]  Name: ANGELLE ISAIS MRN: [id number] Date of Birth: 06/09/1955

## 2017-03-19 NOTE — Therapy (Signed)
[place], [place], [address] Phone: [phone]   Fax:  [phone]  Physical Therapy Evaluation  Patient Details  Name: Heather Gill MRN: [id number] Date of Birth: 11-Jan-1956 Referring Provider: Corky Sing PA-C   Encounter Date: 03/18/2017  PT End of Session - 03/18/17 2152    Visit Number  1    Number of Visits  13    Date for PT Re-Evaluation  04/08/17    Authorization Type  [employer]    Authorization Time Period  03/18/17 - 04/29/17    PT Start Time  1302    PT Stop Time  1346    PT Time Calculation (min)  44 min    Activity Tolerance  Patient tolerated treatment well;No increased pain    Behavior During Therapy  WFL for tasks assessed/performed       Past Medical History:  Diagnosis Date  . Anxiety   . Blood in urine    Dr [place] Pink told her 6 yrs ago-no problems  . Depression   . Glaucoma   . Hormone replacement therapy (HRT)   . Hx of spinal fusion   . Hypothyroid 01/22/2015  . Hypothyroidism   . Osteopenia   . Thyroid nodule   . Urinary incontinence 02/23/2013    Past Surgical History:  Procedure Laterality Date  . BACK SURGERY     3 surg-last with fusion and rods  . BACK SURGERY    . COLONOSCOPY N/A 03/16/2012   Procedure: COLONOSCOPY;  Surgeon: Rogene Houston, MD;  Location: [facility];  Service: Endoscopy;  Laterality: N/A;  730  . DIAGNOSTIC LAPAROSCOPY     dx  . TUBAL LIGATION      There were no vitals filed for this visit.   Subjective Assessment - 03/18/17 2306    Subjective  Patient reports she had a surgery on 12/08/17 where a tendon was removed in her leg and relocated to recreate the arch of her right foot. She states after her surgery she was initially placed in a cast with NWB precautions for 10 weeks. At her 10 week follow up her doctor transitioned her to a boot and told her she could begin putting weight on it as tolerated while in the boot. She  reports her heel pain initially began in June 2018 when she experience swelling in her foot. A doctor at [facility] told her it was tendonitis and put her in an ankle immobilizer for support but this did not help and she was then referred to [place]. She was then told she had an extra bone in her foot and was placed in a cast and told to remain NWB. None of this improved her foot pain and she then underwent surgery on 12/08/16. She reports her pain has been improving since then but it can still get as high as an 8/10 during her daily activities.    Limitations  Walking;Standing;Lifting    How long can you sit comfortably?  unlimited    How long can you stand comfortably?  a few minutes    How long can you walk comfortably?  a few minutes    Patient Stated Goals  be able to walk without pain    Pain Onset  More than a month ago         [facility] PT Assessment - 03/18/17 0001      Assessment   Medical Diagnosis  Right Heel Pain  Onset Date/Surgical Date  12/08/16 pain started back in June 2018    Next MD Visit  03/26/17    Prior Therapy  For neck and low back years ago      Precautions   Precautions  None    Precaution Comments  was NWB for 10 weeks initially after surgery and then put into walking boot      Restrictions   Weight Bearing Restrictions  No      [place] residence    Living Arrangements  Spouse/significant other 2 dogs    Available Help at Discharge  Family    Type of [place] to enter    Entrance Stairs-Number of Steps  2 none throughou garage    Entrance Stairs-Rails  None    Home Layout  Two level    Alternate Level Stairs-Number of Steps  12    Alternate Level Stairs-Rails  Right 30 lb pug      Prior Function   Level of Independence  Independent    Vocation  Retired;On disability    Leisure  reading, walking her dogs      Cognition   Overall Cognitive Status  Within Functional  Limits for tasks assessed      Observation/Other Assessments   Focus on Therapeutic Outcomes (FOTO)   54% limited      Sensation   Additional Comments  Patient reports "fuzzy" sensation along plantar surface of right foot from her metatarsal heads to her toes. She is not drivign as much currently becasue on one occassion her foot slipped off the pedal.      Functional Tests   Functional tests  Squat;Step down;Single leg stance      Squat   Comments  Decreased depth, decresaed hip hinge,genu varus bilatearlly, pes cavus bil, early heel rise bil      Step Down   Comments  Rt LE: unable to perform due to pain; Lt LE: perfomred 5 reps with external support for balance, knee valgus, early heel rise      Single Leg Stance   Comments  Rt LE: 0 sec.  Lt LE: 5 sec.      AROM   Right Ankle Dorsiflexion  5    Right Ankle Plantar Flexion  55    Right Ankle Inversion  35    Right Ankle Eversion  25    Left Ankle Dorsiflexion  10    Left Ankle Plantar Flexion  55    Left Ankle Inversion  35    Left Ankle Eversion  25      Strength   Right Hip Flexion  4/5    Right Hip Extension  3+/5    Right Hip ABduction  4/5    Left Hip Flexion  4/5    Left Hip Extension  3+/5    Left Hip ABduction  4/5    Right Knee Flexion  5/5    Right Knee Extension  5/5    Left Knee Flexion  5/5    Left Knee Extension  5/5    Right Ankle Dorsiflexion  5/5    Right Ankle Plantar Flexion  -- test next session for single limb heel raise    Right Ankle Inversion  4+/5 painful    Right Ankle Eversion  4+/5 painful    Left Ankle Dorsiflexion  5/5    Left Ankle Plantar Flexion  -- test next session  for single limb heel raise    Left Ankle Inversion  5/5    Left Ankle Eversion  5/5      Special Tests    Special Tests  Ankle/Foot Special Tests;Foot Alignment    Ankle/Foot Special Tests   Provocative Tinel's Test;Great Toe Extension Test      Great Toe Extension Test    Comments  Great toe hypomobility and  decreased windlass maneuvar in WB      Provocative Tinel's test    Findings  Negative      Subtalar Neutral (WB)   Comments  Patient in pes cavuse position bilaterally in WB, unable to sustain subtalar neutral position bil       Ambulation/Gait   Ambulation Distance (Feet)  446 Feet 3MWT    Gait Pattern  Step-through pattern;Decreased stance time - right;Decreased step length - left;Decreased dorsiflexion - right;Decreased weight shift to right;Trendelenburg;Antalgic    Gait velocity  0.73 m/s    Stairs  Yes    Stairs Assistance  7: Independent    Stair Management Technique  One rail Right;Step to pattern;Forwards slight side step to descend     Number of Stairs  4    Height of Stairs  6       Objective measurements completed on examination: See above findings.   [facility] Adult PT Treatment/Exercise - 03/18/17 0001      Exercises   Exercises  Ankle      Ankle Exercises: Seated   ABC's  1 rep        PT Education - 03/18/17 2201    Education provided  Yes    Education Details  Educated on exam findings and on initial HEP.    Person(s) Educated  Patient    Methods  Explanation;Handout    Comprehension  Verbalized understanding       PT Short Term Goals - 03/18/17 2228      PT SHORT TERM GOAL #1   Title  Patient will be independent with HEP and be able to demosntrate proper form/technique with exercises to improve functional strength.    Time  3    Period  Weeks    Status  New    Target Date  04/08/17      PT SHORT TERM GOAL #2   Title  Patient will improve MMT by 1/2 grade for limited muscle groups and be pain free with resisted testing to improve functional performance and quality of gait and stair ambulation.    Time  3    Period  Weeks    Status  New      PT SHORT TERM GOAL #3   Title  Patient will improve Rt ankle dorsiflexion to 10 degrees or greater to achieve equal ROM compared to Lt ankle and improve functional drosiflexion for gait.    Time  3    Period   Weeks    Status  New        PT Long Term Goals - 03/19/17 2236      PT LONG TERM GOAL #1   Title  Patient will improve MMT by 1 grade for limited muscle groups and be pain free with resisted testing to improve functional performance and quality of gait and stair ambulation.    Time  6    Period  Weeks    Status  New    Target Date  04/29/17      PT LONG TERM GOAL #2   Title  Patient  will perform SLS for 15 seconds Bil to ipmrove balance durign functional activities like gait and stair ambulation.    Time  6    Period  Weeks    Status  New      PT LONG TERM GOAL #3   Title  Patient will have effective windlass maneuver in WB and perform Bil squat with good depth, no excessive valgus, normal hip hinge, and no early heel rise to demonstrate improved mechanics for functional activites and improve ROM with ankle mobility as well as imrpvoved proximal hip control.    Time  6    Period  Weeks    Status  New          Plan - 03/19/17 2155    Clinical Impression Statement  Patient presents to initial physical therapy evaluation for right heel/foot pain following a surgery on 12/08/16 to improve her right medial arch. Current impairments include decreased proximal hip strength, decreased balance, difficulty with gait, pain with functional mobility, decreased right ankle ROM, and impaired ankle alignment/posture in WB bilaterally. She will benefit from skilled PT services to address impairments and progress towards goals to improve QOL and reduce pain.    History and Personal Factors relevant to plan of care:  MMT, ROM, 3MWT, clinicla judgement, FOTO    Clinical Presentation  Stable    Clinical Decision Making  Low    Rehab Potential  Good    PT Frequency  1x / week    PT Duration  6 weeks    PT Treatment/Interventions  ADLs/Self Care Home Management;Cryotherapy;Electrical Stimulation;Moist Heat;Stair training;Functional mobility training;Therapeutic activities;Therapeutic  exercise;Balance training;Neuromuscular re-education;Gait training;Patient/family education;Passive range of motion;Manual techniques;Scar mobilization;Energy conservation;Taping    PT Next Visit Plan  Review initial evaluation and goals. Initiate manual therpay forAP glide to talocrural joint. Begin BAPS, 4 way ankle, and heel raises. Initiate proximal hip strengthening.    PT Home Exercise Plan  Eval: ankle ABC's, towel crunch    Consulted and Agree with Plan of Care  Patient       Patient will benefit from skilled therapeutic intervention in order to improve the following deficits and impairments:  Abnormal gait, Decreased balance, Decreased endurance, Decreased mobility, Difficulty walking, Hypomobility, Decreased range of motion, Decreased activity tolerance, Decreased strength, Impaired flexibility, Pain  Visit Diagnosis: Pain in right ankle and joints of right foot - Plan: PT plan of care cert/re-cert  Stiffness of right foot, not elsewhere classified - Plan: PT plan of care cert/re-cert  Muscle weakness (generalized) - Plan: PT plan of care cert/re-cert  Other abnormalities of gait and mobility - Plan: PT plan of care cert/re-cert     Problem List Patient Active Problem List   Diagnosis Date Noted  . Vitamin D deficiency 05/15/2015  . Osteopenia 04/03/2015  . Hypothyroidism 01/22/2015  . Postmenopausal HRT (hormone replacement therapy) 02/23/2013  . Urinary incontinence 02/23/2013    Kipp Brood, PT, DPT Physical Therapist with [facility]  03/19/2017 10:37 PM    [place], [place], [address] Phone: [phone]   Fax:  [phone]  Name: Heather Gill MRN: [id number] Date of Birth: 08-06-1955

## 2017-03-22 ENCOUNTER — Encounter: Payer: Self-pay | Admitting: Adult Health

## 2017-03-22 ENCOUNTER — Ambulatory Visit (INDEPENDENT_AMBULATORY_CARE_PROVIDER_SITE_OTHER): Payer: Medicare Other | Admitting: Adult Health

## 2017-03-22 ENCOUNTER — Other Ambulatory Visit (HOSPITAL_COMMUNITY)
Admission: RE | Admit: 2017-03-22 | Discharge: 2017-03-22 | Disposition: A | Discharge status: Home / Self Care | Payer: Medicare Other | Source: Ambulatory Visit | Attending: Adult Health | Admitting: Adult Health

## 2017-03-22 VITALS — BP 124/76 | HR 86 | Resp 16 | Ht 66.25 in | Wt 194.0 lb

## 2017-03-22 DIAGNOSIS — Z1211 Encounter for screening for malignant neoplasm of colon: Secondary | ICD-10-CM

## 2017-03-22 DIAGNOSIS — Z01419 Encounter for gynecological examination (general) (routine) without abnormal findings: Secondary | ICD-10-CM | POA: Insufficient documentation

## 2017-03-22 DIAGNOSIS — Z1212 Encounter for screening for malignant neoplasm of rectum: Secondary | ICD-10-CM | POA: Diagnosis not present

## 2017-03-22 LAB — HEMOCCULT GUIAC POC 1CARD (OFFICE): FECAL OCCULT BLD: NEGATIVE

## 2017-03-22 MED ORDER — NYSTATIN 100000 UNIT/GM EX POWD: Freq: Three times a day (TID) | CUTANEOUS | 3 refills | Status: DC

## 2017-03-22 NOTE — Patient Instructions (Signed)
DASH Eating Plan DASH stands for "Dietary Approaches to Stop Hypertension." The DASH eating plan is a healthy eating plan that has been shown to reduce high blood pressure (hypertension). It may also reduce your risk for type 2 diabetes, heart disease, and stroke. The DASH eating plan may also help with weight loss. What are tips for following this plan? General guidelines  Avoid eating more than 2,300 mg (milligrams) of salt (sodium) a day. If you have hypertension, you may need to reduce your sodium intake to 1,500 mg a day.  Limit alcohol intake to no more than 1 drink a day for nonpregnant women and 2 drinks a day for men. One drink equals 12 oz of beer, 5 oz of wine, or 1 oz of hard liquor.  Work with your health care provider to maintain a healthy body weight or to lose weight. Ask what an ideal weight is for you.  Get at least 30 minutes of exercise that causes your heart to beat faster (aerobic exercise) most days of the week. Activities may include walking, swimming, or biking.  Work with your health care provider or diet and nutrition specialist (dietitian) to adjust your eating plan to your individual calorie needs. Reading food labels  Check food labels for the amount of sodium per serving. Choose foods with less than 5 percent of the Daily Value of sodium. Generally, foods with less than 300 mg of sodium per serving fit into this eating plan.  To find whole grains, look for the word "whole" as the first word in the ingredient list. Shopping  Buy products labeled as "low-sodium" or "no salt added."  Buy fresh foods. Avoid canned foods and premade or frozen meals. Cooking  Avoid adding salt when cooking. Use salt-free seasonings or herbs instead of table salt or sea salt. Check with your health care provider or pharmacist before using salt substitutes.  Do not fry foods. Cook foods using healthy methods such as baking, boiling, grilling, and broiling instead.  Cook with  heart-healthy oils, such as olive, canola, soybean, or sunflower oil. Meal planning   Eat a balanced diet that includes: ? 5 or more servings of fruits and vegetables each day. At each meal, try to fill half of your plate with fruits and vegetables. ? Up to 6-8 servings of whole grains each day. ? Less than 6 oz of lean meat, poultry, or fish each day. A 3-oz serving of meat is about the same size as a deck of cards. One egg equals 1 oz. ? 2 servings of low-fat dairy each day. ? A serving of nuts, seeds, or beans 5 times each week. ? Heart-healthy fats. Healthy fats called Omega-3 fatty acids are found in foods such as flaxseeds and coldwater fish, like sardines, salmon, and mackerel.  Limit how much you eat of the following: ? Canned or prepackaged foods. ? Food that is high in trans fat, such as fried foods. ? Food that is high in saturated fat, such as fatty meat. ? Sweets, desserts, sugary drinks, and other foods with added sugar. ? Full-fat dairy products.  Do not salt foods before eating.  Try to eat at least 2 vegetarian meals each week.  Eat more home-cooked food and less restaurant, buffet, and fast food.  When eating at a restaurant, ask that your food be prepared with less salt or no salt, if possible. What foods are recommended? The items listed may not be a complete list. Talk with your dietitian about what   dietary choices are best for you. Grains Whole-grain or whole-wheat bread. Whole-grain or whole-wheat pasta. Brown rice. Oatmeal. Quinoa. Bulgur. Whole-grain and low-sodium cereals. Pita bread. Low-fat, low-sodium crackers. Whole-wheat flour tortillas. Vegetables Fresh or frozen vegetables (raw, steamed, roasted, or grilled). Low-sodium or reduced-sodium tomato and vegetable juice. Low-sodium or reduced-sodium tomato sauce and tomato paste. Low-sodium or reduced-sodium canned vegetables. Fruits All fresh, dried, or frozen fruit. Canned fruit in natural juice (without  added sugar). Meat and other protein foods Skinless chicken or turkey. Ground chicken or turkey. Pork with fat trimmed off. Fish and seafood. Egg whites. Dried beans, peas, or lentils. Unsalted nuts, nut butters, and seeds. Unsalted canned beans. Lean cuts of beef with fat trimmed off. Low-sodium, lean deli meat. Dairy Low-fat (1%) or fat-free (skim) milk. Fat-free, low-fat, or reduced-fat cheeses. Nonfat, low-sodium ricotta or cottage cheese. Low-fat or nonfat yogurt. Low-fat, low-sodium cheese. Fats and oils Soft margarine without trans fats. Vegetable oil. Low-fat, reduced-fat, or light mayonnaise and salad dressings (reduced-sodium). Canola, safflower, olive, soybean, and sunflower oils. Avocado. Seasoning and other foods Herbs. Spices. Seasoning mixes without salt. Unsalted popcorn and pretzels. Fat-free sweets. What foods are not recommended? The items listed may not be a complete list. Talk with your dietitian about what dietary choices are best for you. Grains Baked goods made with fat, such as croissants, muffins, or some breads. Dry pasta or rice meal packs. Vegetables Creamed or fried vegetables. Vegetables in a cheese sauce. Regular canned vegetables (not low-sodium or reduced-sodium). Regular canned tomato sauce and paste (not low-sodium or reduced-sodium). Regular tomato and vegetable juice (not low-sodium or reduced-sodium). Pickles. Olives. Fruits Canned fruit in a light or heavy syrup. Fried fruit. Fruit in cream or butter sauce. Meat and other protein foods Fatty cuts of meat. Ribs. Fried meat. Bacon. Sausage. Bologna and other processed lunch meats. Salami. Fatback. Hotdogs. Bratwurst. Salted nuts and seeds. Canned beans with added salt. Canned or smoked fish. Whole eggs or egg yolks. Chicken or turkey with skin. Dairy Whole or 2% milk, cream, and half-and-half. Whole or full-fat cream cheese. Whole-fat or sweetened yogurt. Full-fat cheese. Nondairy creamers. Whipped toppings.  Processed cheese and cheese spreads. Fats and oils Butter. Stick margarine. Lard. Shortening. Ghee. Bacon fat. Tropical oils, such as coconut, palm kernel, or palm oil. Seasoning and other foods Salted popcorn and pretzels. Onion salt, garlic salt, seasoned salt, table salt, and sea salt. Worcestershire sauce. Tartar sauce. Barbecue sauce. Teriyaki sauce. Soy sauce, including reduced-sodium. Steak sauce. Canned and packaged gravies. Fish sauce. Oyster sauce. Cocktail sauce. Horseradish that you find on the shelf. Ketchup. Mustard. Meat flavorings and tenderizers. Bouillon cubes. Hot sauce and Tabasco sauce. Premade or packaged marinades. Premade or packaged taco seasonings. Relishes. Regular salad dressings. Where to find more information:  National Heart, Lung, and Blood Institute: www.nhlbi.nih.gov  American Heart Association: www.heart.org Summary  The DASH eating plan is a healthy eating plan that has been shown to reduce high blood pressure (hypertension). It may also reduce your risk for type 2 diabetes, heart disease, and stroke.  With the DASH eating plan, you should limit salt (sodium) intake to 2,300 mg a day. If you have hypertension, you may need to reduce your sodium intake to 1,500 mg a day.  When on the DASH eating plan, aim to eat more fresh fruits and vegetables, whole grains, lean proteins, low-fat dairy, and heart-healthy fats.  Work with your health care provider or diet and nutrition specialist (dietitian) to adjust your eating plan to your individual   calorie needs. This information is not intended to replace advice given to you by your health care provider. Make sure you discuss any questions you have with your health care provider. Document Released: 01/15/2011 Document Revised: 01/20/2016 Document Reviewed: 01/20/2016 Elsevier Interactive Patient Education  2018 Elsevier Inc.  

## 2017-03-22 NOTE — Progress Notes (Signed)
Patient ID: Heather Gill, female   DOB: 11/01/1955, 62 y.o.   MRN: [id number] History of Present Illness:  Heather Gill is a 62 year old white female, married,PM in for a well woman gyn exam and pap. PCP is Dr Willey Blade and she sees Dr Dorris Fetch.  Current Medications, Allergies, Past Medical History, Past Surgical History, Family History and Social History were reviewed in [employer] record.     Review of Systems:  Patient denies any headaches, hearing loss, fatigue, blurred vision, shortness of breath, chest pain(did have the other day,but not now), abdominal pain, problems with bowel movements, urination, or intercourse. No joint pain or mood swings. Has sweating under breasts, non productive cough, and feels dizzy at times, esp with rising.    Physical Exam:BP 124/76 (BP Location: Left Arm, Patient Position: Sitting, Cuff Size: Normal)   Pulse 86   Resp 16   Ht 5' 6.25" (1.683 m)   Wt 194 lb (88 kg) Comment: with  boot  BMI 31.08 kg/m  General:  Well developed, well nourished, no acute distress Skin:  Warm and dry Neck:  Midline trachea, right thyroid nodule felt, good ROM, no lymphadenopathy Lungs; Clear to auscultation bilaterally Breast:  No dominant palpable mass, retraction, or nipple discharge Cardiovascular: Regular rate and rhythm Abdomen:  Soft, non tender, no hepatosplenomegaly Pelvic:  External genitalia is normal in appearance, no lesions.  The vagina is normal in appearance for age with loss of moisture and rugae. Urethra has no lesions or masses. The cervix is smooth, pap with HPV performed.  Uterus is felt to be normal size, shape, and contour.  No adnexal masses or tenderness noted.Bladder is non tender, no masses felt. Rectal: Good sphincter tone, no polyps, or hemorrhoids felt.  Hemoccult negative. Extremities/musculoskeletal:  No swelling or varicosities noted, no clubbing, has boot on right foot and toes are purple Psych:  No mood changes, alert and  cooperative,seems happy PHQ 2 score 0. Use nystatin under breasts prn.  Impression: 1. Encounter for gynecological examination with Papanicolaou smear of cervix   2. Screening for colorectal cancer       Plan: Physical in 1 year Pap in 3 if normal Mammogram yearly Colonoscopy per GI Copy of labs given to review with PCP Meds ordered this encounter  Medications  . nystatin (MYCOSTATIN/NYSTOP) powder    Sig: Apply topically 3 (three) times daily.    Dispense:  45 g    Refill:  3    Order Specific Question:   Supervising Provider    Answer:   Florian Buff [[id number]]  Get EKG with next PCP visit Use good lubricate with intercourse

## 2017-03-25 ENCOUNTER — Telehealth: Payer: Self-pay | Admitting: Adult Health

## 2017-03-25 ENCOUNTER — Telehealth (HOSPITAL_COMMUNITY): Payer: Self-pay

## 2017-03-25 LAB — CYTOLOGY - PAP: HPV (WINDOPATH): NOT DETECTED

## 2017-03-25 NOTE — Telephone Encounter (Signed)
Patient is sick and canceled her appt for 03-26-17

## 2017-03-25 NOTE — Telephone Encounter (Signed)
Left message that pap showed some atypical cells and atrophy, negative HPV, so repeat pap in 1 year, instead of 3

## 2017-03-26 ENCOUNTER — Ambulatory Visit (HOSPITAL_COMMUNITY): Payer: Medicare Other

## 2017-04-02 ENCOUNTER — Encounter (HOSPITAL_COMMUNITY): Payer: Self-pay

## 2017-04-02 ENCOUNTER — Ambulatory Visit (HOSPITAL_COMMUNITY): Payer: Medicare Other

## 2017-04-02 DIAGNOSIS — M6281 Muscle weakness (generalized): Secondary | ICD-10-CM

## 2017-04-02 DIAGNOSIS — R2689 Other abnormalities of gait and mobility: Secondary | ICD-10-CM | POA: Diagnosis not present

## 2017-04-02 DIAGNOSIS — M25571 Pain in right ankle and joints of right foot: Secondary | ICD-10-CM | POA: Diagnosis not present

## 2017-04-02 DIAGNOSIS — M47816 Spondylosis without myelopathy or radiculopathy, lumbar region: Secondary | ICD-10-CM | POA: Diagnosis not present

## 2017-04-02 DIAGNOSIS — M25674 Stiffness of right foot, not elsewhere classified: Secondary | ICD-10-CM

## 2017-04-02 NOTE — Patient Instructions (Signed)
Gastroc Stretch    Stand with right foot back, leg straight, forward leg bent. Keeping heel on floor, turned slightly out, lean into wall until stretch is felt in calf. Hold 30 seconds. Repeat 3 times per set. Do 1-2 sessions per day.  http://orth.exer.us/27   Copyright  VHI. All rights reserved.

## 2017-04-02 NOTE — Therapy (Signed)
[place], [place], [address] Phone: [phone]   Fax:  [phone]  Physical Therapy Treatment  Patient Details  Name: Heather Gill MRN: [id number] Date of Birth: 1955/04/02 Referring Provider: Corky Sing PA-C   Encounter Date: 04/02/2017  PT End of Session - 04/02/17 1402    Visit Number  2    Number of Visits  13    Date for PT Re-Evaluation  04/08/17    Authorization Type  [employer]    Authorization Time Period  03/18/17 - 04/29/17    PT Start Time  1349    PT Stop Time  1430    PT Time Calculation (min)  41 min    Activity Tolerance  Patient tolerated treatment well;No increased pain    Behavior During Therapy  WFL for tasks assessed/performed       Past Medical History:  Diagnosis Date  . Anxiety   . Blood in urine    Dr [place] Pink told her 6 yrs ago-no problems  . Depression   . Glaucoma   . Hormone replacement therapy (HRT)   . Hx of spinal fusion   . Hypertension   . Hypothyroid 01/22/2015  . Hypothyroidism   . Osteopenia   . Raynauds syndrome   . Thyroid nodule   . Urinary incontinence 02/23/2013    Past Surgical History:  Procedure Laterality Date  . BACK SURGERY     3 surg-last with fusion and rods  . BACK SURGERY    . COLONOSCOPY N/A 03/16/2012   Procedure: COLONOSCOPY;  Surgeon: Rogene Houston, MD;  [facility]: [facility];  Service: Endoscopy;  Laterality: N/A;  730  . DIAGNOSTIC LAPAROSCOPY     dx  . FOOT TENDON SURGERY    . TUBAL LIGATION      There were no vitals filed for this visit.  Subjective Assessment - 04/02/17 1351    Subjective  Pt reports Rt heel feels like a "bone bruise" with shin pain, current pain scale 5/10 for Rt heel and arthritis in great toe.  Went to Chief of Staff earlier and has been on her feet a lot today, feels maybe related to increased pain.  Reports compliance with HEP daily.    Patient Stated Goals  be able to walk without  pain    Currently in Pain?  Yes    Pain Score  5     Pain Location  Heel    Pain Orientation  Right    Pain Descriptors / Indicators  -- Deep bone bruise    Pain Type  Chronic pain    Pain Onset  More than a month ago    Pain Frequency  Constant    Aggravating Factors   walking, standing WB    Pain Relieving Factors  rest, ice, elevation, Ibprofen    Effect of Pain on Daily Activities  moderately limited                       [facility] Adult PT Treatment/Exercise - 04/02/17 0001      Manual Therapy   Manual Therapy  Joint mobilization    Manual therapy comments  Manual complete separate than rest of tx    Joint Mobilization  talocrucal AP joint mobs      Ankle Exercises: Seated   ABC's  1 rep    Towel Crunch  2 reps    Towel Inversion/Eversion  2 reps  Heel Raises  10 reps    Toe Raise  10 reps    BAPS  Sitting;Level 2;10 reps      Ankle Exercises: Stretches   Gastroc Stretch  1 rep;30 seconds for HEP    Slant Board Stretch  2 reps;30 seconds      Ankle Exercises: Supine   Other Supine Ankle Exercises  Bridge 10x             PT Education - 04/02/17 1404    Education provided  Yes    Education Details  Reviewed goals, assured complaince wiht HEP and copy of eval given to pt.      Person(s) Educated  Patient    Methods  Explanation;Demonstration;Handout;Verbal cues    Comprehension  Verbalized understanding;Returned demonstration       PT Short Term Goals - 03/18/17 2228      PT SHORT TERM GOAL #1   Title  Patient will be independent with HEP and be able to demosntrate proper form/technique with exercises to improve functional strength.    Time  3    Period  Weeks    Status  New    Target Date  04/08/17      PT SHORT TERM GOAL #2   Title  Patient will improve MMT by 1/2 grade for limited muscle groups and be pain free with resisted testing to improve functional performance and quality of gait and stair ambulation.    Time  3    Period  Weeks     Status  New      PT SHORT TERM GOAL #3   Title  Patient will improve Rt ankle dorsiflexion to 10 degrees or greater to achieve equal ROM compared to Lt ankle and improve functional drosiflexion for gait.    Time  3    Period  Weeks    Status  New        PT Long Term Goals - 03/19/17 2236      PT LONG TERM GOAL #1   Title  Patient will improve MMT by 1 grade for limited muscle groups and be pain free with resisted testing to improve functional performance and quality of gait and stair ambulation.    Time  6    Period  Weeks    Status  New    Target Date  04/29/17      PT LONG TERM GOAL #2   Title  Patient will perform SLS for 15 seconds Bil to ipmrove balance durign functional activities like gait and stair ambulation.    Time  6    Period  Weeks    Status  New      PT LONG TERM GOAL #3   Title  Patient will have effective windlass maneuver in WB and perform Bil squat with good depth, no excessive valgus, normal hip hinge, and no early heel rise to demonstrate improved mechanics for functional activites and improve ROM with ankle mobility as well as imrpvoved proximal hip control.    Time  6    Period  Weeks    Status  New            Plan - 04/02/17 1432    Clinical Impression Statement  Reviewed goals, assured compliance and reviewed form/technique with HEP, copy of eval given to pt.  Session focus on ankle mobiltiy with new exercises added (inv/ev, heel/toe raises, BAPS board and gastroc stretch) and manual joint mobs to improve talocrucal mobilty to assist with  dorsiflexion.  Pt able to complete all exercises with minimal difficutly, therapist facilitation to reduce compesation with hip/knee movements.  Pt reports increase by 1 grade pain scale at EOS though reports improved mobility.      Rehab Potential  Good    PT Frequency  1x / week    PT Duration  6 weeks    PT Treatment/Interventions  ADLs/Self Care Home Management;Cryotherapy;Electrical Stimulation;Moist  Heat;Stair training;Functional mobility training;Therapeutic activities;Therapeutic exercise;Balance training;Neuromuscular re-education;Gait training;Patient/family education;Passive range of motion;Manual techniques;Scar mobilization;Energy conservation;Taping    PT Next Visit Plan  Continue wiht manual therpay forAP glide to talocrural joint.  Increased to L3 with BAPS, theraband with 4 way ankle, and heel raises and  proximal hip strengthening.    PT Home Exercise Plan  Eval: ankle ABC's, towel crunch, gastroc stretch        Patient will benefit from skilled therapeutic intervention in order to improve the following deficits and impairments:  Abnormal gait, Decreased balance, Decreased endurance, Decreased mobility, Difficulty walking, Hypomobility, Decreased range of motion, Decreased activity tolerance, Decreased strength, Impaired flexibility, Pain  Visit Diagnosis: Pain in right ankle and joints of right foot  Stiffness of right foot, not elsewhere classified  Muscle weakness (generalized)  Other abnormalities of gait and mobility     Problem List Patient Active Problem List   Diagnosis Date Noted  . Encounter for gynecological examination with Papanicolaou smear of cervix 03/22/2017  . Vitamin D deficiency 05/15/2015  . Osteopenia 04/03/2015  . Hypothyroidism 01/22/2015  . Postmenopausal HRT (hormone replacement therapy) 02/23/2013  . Urinary incontinence 02/23/2013   Ihor Austin, [place]; [place]  Aldona Lento 04/02/2017, 2:51 PM  [facility] [address] [place], [place], [address] Phone: [phone]   Fax:  [phone]  Name: Heather Gill MRN: [id number] Date of Birth: [birth year]

## 2017-04-09 ENCOUNTER — Telehealth (HOSPITAL_COMMUNITY): Payer: Self-pay | Admitting: Internal Medicine

## 2017-04-09 ENCOUNTER — Ambulatory Visit (HOSPITAL_COMMUNITY): Payer: Medicare Other

## 2017-04-09 NOTE — Telephone Encounter (Signed)
04/09/17  pt called to cx said she couldn't make it today

## 2017-04-14 ENCOUNTER — Telehealth (HOSPITAL_COMMUNITY): Payer: Self-pay | Admitting: Internal Medicine

## 2017-04-14 DIAGNOSIS — M79671 Pain in right foot: Secondary | ICD-10-CM | POA: Diagnosis not present

## 2017-04-14 NOTE — Telephone Encounter (Signed)
04/14/17  Pt called to say that she pressed 2 on phone tree by mistake... She will be here for this appt.

## 2017-04-15 ENCOUNTER — Encounter (HOSPITAL_COMMUNITY): Payer: Self-pay

## 2017-04-15 ENCOUNTER — Other Ambulatory Visit: Payer: Self-pay

## 2017-04-15 ENCOUNTER — Ambulatory Visit (HOSPITAL_COMMUNITY): Payer: Medicare Other | Attending: Student

## 2017-04-15 DIAGNOSIS — M25571 Pain in right ankle and joints of right foot: Secondary | ICD-10-CM | POA: Diagnosis not present

## 2017-04-15 DIAGNOSIS — M25674 Stiffness of right foot, not elsewhere classified: Secondary | ICD-10-CM | POA: Insufficient documentation

## 2017-04-15 DIAGNOSIS — M6281 Muscle weakness (generalized): Secondary | ICD-10-CM | POA: Insufficient documentation

## 2017-04-15 DIAGNOSIS — R2689 Other abnormalities of gait and mobility: Secondary | ICD-10-CM | POA: Diagnosis not present

## 2017-04-15 NOTE — Therapy (Signed)
[place], [place], [address] Phone: [phone]   Fax:  [phone]  Physical Therapy Treatment/Discharge Summary  Patient Details  Name: Heather Gill MRN: [id number] Date of Birth: 07-27-1955 Referring Provider: Corky Sing PA-C   Encounter Date: 04/15/2017  PHYSICAL THERAPY DISCHARGE SUMMARY  Visits from Start of Care: 3  Current functional level related to goals / functional outcomes: Re-assessment performed today and patient has met/partially met all goals. She has demonstrated excellent performance of HEP and has minimal to no pain at times reporting a 1/10 today. She states she feels she has made a 90% improvement in function since starting therapy and feels like she can manage independently. She has improved a significant amount in her FOTO score from feeling 54% limited to only 17% limited. She will be discharged after this session given her improvements. She has been educated on the benefits of participating in regular recreational exercise and provided handouts.    Remaining deficits: See below details. Patient has met all goals.   Education / Equipment: Educated on improvements since the start of therpay and progress with goals. Educated on benefits of participating in recreational exercise program at local gym facility. Discussed safe progression of walking program to prevent acute tnedonitits by increasing activity too quickly and safely increase walkign mileage.   Plan: Patient agrees to discharge.  Patient goals were met. Patient is being discharged due to meeting the stated rehab goals.  ?????      PT End of Session - 04/15/17 1431    Visit Number  2    Number of Visits  13    Date for PT Re-Evaluation  04/08/17    Authorization Type  [employer] Medicare    Authorization Time Period  03/18/17 - 04/29/17    PT Start Time  1434    PT Stop Time  1500    PT Time Calculation (min)  26  min    Activity Tolerance  Patient tolerated treatment well;No increased pain    Behavior During Therapy  WFL for tasks assessed/performed       Past Medical History:  Diagnosis Date  . Anxiety   . Blood in urine    Dr Maryland Pink told her 6 yrs ago-no problems  . Depression   . Glaucoma   . Hormone replacement therapy (HRT)   . Hx of spinal fusion   . Hypertension   . Hypothyroid 01/22/2015  . Hypothyroidism   . Osteopenia   . Raynauds syndrome   . Thyroid nodule   . Urinary incontinence 02/23/2013    Past Surgical History:  Procedure Laterality Date  . BACK SURGERY     3 surg-last with fusion and rods  . BACK SURGERY    . COLONOSCOPY N/A 03/16/2012   Procedure: COLONOSCOPY;  Surgeon: Rogene Houston, MD;  [facility];  Service: Endoscopy;  Laterality: N/A;  730  . DIAGNOSTIC LAPAROSCOPY     dx  . FOOT TENDON SURGERY    . TUBAL LIGATION      There were no vitals filed for this visit.  Subjective Assessment - 04/15/17 1524    Subjective  Patient feels like she has made a 90% improvement since starting therapy. She reports minimal pain and has been performing her HEP every day.     How long can you sit comfortably?  unlimited    How long can you stand comfortably?  unlimited    How long  can you walk comfortably?  unlimited    Patient Stated Goals  be able to walk without pain    Currently in Pain?  Yes    Pain Score  1     Pain Location  Heel    Pain Orientation  Right    Pain Descriptors / Indicators  Aching;Dull    Pain Type  Chronic pain    Pain Onset  More than a month ago    Pain Frequency  Constant    Aggravating Factors   walking, standing    Pain Relieving Factors  rest, ice         [facility] PT Assessment - 04/15/17 0001      Assessment   Medical Diagnosis  Right Heel Pain    Referring Provider  Corky Sing PA-C    Onset Date/Surgical Date  12/08/16      Observation/Other Assessments   Focus on Therapeutic Outcomes (FOTO)   17%  was  54% on 03/18/17      Single Leg Stance   Comments  Rt LE = 15 seconds; Lt LE = 15 seconds Was 0 sec on Rt and 5 sec on Lt      AROM   Right Ankle Dorsiflexion  8    Right Ankle Plantar Flexion  55    Right Ankle Inversion  35    Right Ankle Eversion  25    Left Ankle Dorsiflexion  10    Left Ankle Plantar Flexion  55    Left Ankle Inversion  35    Left Ankle Eversion  25      Strength   Right Hip Flexion  5/5    Right Hip Extension  5/5    Right Hip ABduction  5/5    Left Hip Flexion  5/5    Left Hip Extension  5/5    Left Hip ABduction  5/5    Right Knee Flexion  5/5    Right Knee Extension  5/5    Left Knee Flexion  5/5    Right Ankle Dorsiflexion  5/5    Right Ankle Plantar Flexion  5/5 20/20 single leg heel raise    Right Ankle Inversion  5/5    Right Ankle Eversion  5/5    Left Ankle Dorsiflexion  5/5    Left Ankle Plantar Flexion  5/5 20/20 single leg heel raise    Left Ankle Inversion  5/5    Left Ankle Eversion  5/5      Great Toe Extension Test    Comments  Effective Windlass maneuver Bil LE        PT Education - 04/15/17 1504    Education provided  Yes    Education Details  Educated on improvements since the start of therpay and progress with goals. Educated on benefits of participating in recreational exercise program at local gym facility. Discussed safe progression of walking program to prevent acute tnedonitits by increasing activity too quickly and safely increase walkign mileage.     Person(s) Educated  Patient    Methods  Explanation;Handout    Comprehension  Verbalized understanding       PT Short Term Goals - 04/15/17 1435      PT SHORT TERM GOAL #1   Title  Patient will be independent with HEP and be able to demosntrate proper form/technique with exercises to improve functional strength.    Time  3    Period  Weeks    Status  Achieved      PT SHORT TERM GOAL #2   Title  Patient will improve MMT by 1/2 grade for limited muscle groups and be  pain free with resisted testing to improve functional performance and quality of gait and stair ambulation.    Time  3    Period  Weeks    Status  Achieved      PT SHORT TERM GOAL #3   Title  Patient will improve Rt ankle dorsiflexion to 10 degrees or greater to achieve equal ROM compared to Lt ankle and improve functional drosiflexion for gait.    Baseline  04/15/17 - 8 degrees on Rt LE    Time  3    Period  Weeks    Status  Partially Met        PT Long Term Goals - 04/15/17 1436      PT LONG TERM GOAL #1   Title  Patient will improve MMT by 1 grade for limited muscle groups and be pain free with resisted testing to improve functional performance and quality of gait and stair ambulation.    Time  6    Period  Weeks    Status  Achieved      PT LONG TERM GOAL #2   Title  Patient will perform SLS for 15 seconds Bil to ipmrove balance durign functional activities like gait and stair ambulation.    Time  6    Period  Weeks    Status  Achieved      PT LONG TERM GOAL #3   Title  Patient will have effective windlass maneuver in WB and perform Bil squat with good depth, no excessive valgus, normal hip hinge, and no early heel rise to demonstrate improved mechanics for functional activites and improve ROM with ankle mobility as well as imrpvoved proximal hip control.    Time  6    Period  Weeks    Status  Achieved        Plan - 04/15/17 1512    Clinical Impression Statement  Re-assessment performed today and patient has met/partially met all goals. She has demonstrated excellent performance of HEP and has minimal to no pain at times reporting a 1/10 today. She states she feels she has made a 90% improvement in function since starting therapy and feels like she can manage independently. She has improved a significant amount in her FOTO score from feeling 54% limited to only 17% limited. She will be discharged after this session given her improvements. She has been educated on the benefits  of participating in regular recreational exercise and provided handouts.     Rehab Potential  Good    PT Frequency  1x / week    PT Duration  6 weeks    PT Treatment/Interventions  ADLs/Self Care Home Management;Cryotherapy;Electrical Stimulation;Moist Heat;Stair training;Functional mobility training;Therapeutic activities;Therapeutic exercise;Balance training;Neuromuscular re-education;Gait training;Patient/family education;Passive range of motion;Manual techniques;Scar mobilization;Energy conservation;Taping    PT Next Visit Plan  Discharge this session.    PT Home Exercise Plan  Eval: ankle ABC's, towel crunch, gastroc stretch     Consulted and Agree with Plan of Care  Patient       Patient will benefit from skilled therapeutic intervention in order to improve the following deficits and impairments:  Abnormal gait, Decreased balance, Decreased endurance, Decreased mobility, Difficulty walking, Hypomobility, Decreased range of motion, Decreased activity tolerance, Decreased strength, Impaired flexibility, Pain  Visit Diagnosis: Pain in right ankle and joints of right foot  Stiffness of right foot, not elsewhere classified  Muscle weakness (generalized)  Other abnormalities of gait and mobility     Problem List Patient Active Problem List   Diagnosis Date Noted  . Encounter for gynecological examination with Papanicolaou smear of cervix 03/22/2017  . Vitamin D deficiency 05/15/2015  . Osteopenia 04/03/2015  . Hypothyroidism 01/22/2015  . Postmenopausal HRT (hormone replacement therapy) 02/23/2013  . Urinary incontinence 02/23/2013    Kipp Brood, PT, DPT Physical Therapist with [facility]  04/15/2017 3:30 PM    [place], [place], [address] Phone: [phone]   Fax:  [phone]  Name: Heather Gill MRN: [id number] Date of Birth: 04-Sep-1955

## 2017-04-23 ENCOUNTER — Encounter (HOSPITAL_COMMUNITY): Payer: Medicare Other

## 2017-04-30 ENCOUNTER — Encounter (HOSPITAL_COMMUNITY): Payer: Medicare Other

## 2017-05-06 ENCOUNTER — Encounter (HOSPITAL_COMMUNITY): Payer: Medicare Other

## 2017-06-08 DIAGNOSIS — R05 Cough: Secondary | ICD-10-CM | POA: Diagnosis not present

## 2017-06-11 ENCOUNTER — Ambulatory Visit: Payer: Medicare Other | Admitting: "Endocrinology

## 2017-06-14 DIAGNOSIS — K30 Functional dyspepsia: Secondary | ICD-10-CM | POA: Diagnosis not present

## 2017-06-14 DIAGNOSIS — I10 Essential (primary) hypertension: Secondary | ICD-10-CM | POA: Diagnosis not present

## 2017-06-14 DIAGNOSIS — Z79899 Other long term (current) drug therapy: Secondary | ICD-10-CM | POA: Diagnosis not present

## 2017-06-30 DIAGNOSIS — E785 Hyperlipidemia, unspecified: Secondary | ICD-10-CM | POA: Diagnosis not present

## 2017-06-30 DIAGNOSIS — I10 Essential (primary) hypertension: Secondary | ICD-10-CM | POA: Diagnosis not present

## 2017-07-16 DIAGNOSIS — R490 Dysphonia: Secondary | ICD-10-CM | POA: Diagnosis not present

## 2017-08-09 DIAGNOSIS — R490 Dysphonia: Secondary | ICD-10-CM | POA: Diagnosis not present

## 2017-08-10 ENCOUNTER — Other Ambulatory Visit: Payer: Self-pay | Admitting: "Endocrinology

## 2017-08-10 ENCOUNTER — Ambulatory Visit: Payer: Medicare Other | Admitting: "Endocrinology

## 2017-08-10 DIAGNOSIS — E039 Hypothyroidism, unspecified: Secondary | ICD-10-CM

## 2017-08-14 ENCOUNTER — Other Ambulatory Visit: Payer: Self-pay | Admitting: "Endocrinology

## 2017-08-17 DIAGNOSIS — K219 Gastro-esophageal reflux disease without esophagitis: Secondary | ICD-10-CM | POA: Diagnosis not present

## 2017-08-17 DIAGNOSIS — R490 Dysphonia: Secondary | ICD-10-CM | POA: Diagnosis not present

## 2017-08-26 DIAGNOSIS — E039 Hypothyroidism, unspecified: Secondary | ICD-10-CM | POA: Diagnosis not present

## 2017-08-27 LAB — T4, FREE: Free T4: 1.1 ng/dL (ref 0.8–1.8)

## 2017-08-27 LAB — TSH: TSH: 3.1 mIU/L (ref 0.40–4.50)

## 2017-09-02 ENCOUNTER — Encounter: Payer: Self-pay | Admitting: "Endocrinology

## 2017-09-02 ENCOUNTER — Ambulatory Visit: Payer: Medicare Other | Admitting: "Endocrinology

## 2017-09-02 VITALS — BP 113/83 | HR 96 | Ht 66.25 in | Wt 189.0 lb

## 2017-09-02 DIAGNOSIS — E039 Hypothyroidism, unspecified: Secondary | ICD-10-CM

## 2017-09-02 DIAGNOSIS — E559 Vitamin D deficiency, unspecified: Secondary | ICD-10-CM | POA: Diagnosis not present

## 2017-09-02 DIAGNOSIS — M8580 Other specified disorders of bone density and structure, unspecified site: Secondary | ICD-10-CM

## 2017-09-02 MED ORDER — LEVOTHYROXINE SODIUM 50 MCG PO TABS: ORAL_TABLET | ORAL | 1 refills | Status: DC

## 2017-09-02 NOTE — Progress Notes (Signed)
Endocrinology follow-up note Subjective:    Patient ID: Heather Gill, female    DOB: [birth year], PCP Asencion Noble, MD   Past Medical History:  Diagnosis Date  . Anxiety   . Blood in urine    Dr [place] Pink told her 6 yrs ago-no problems  . Depression   . Glaucoma   . Hormone replacement therapy (HRT)   . Hx of spinal fusion   . Hypertension   . Hypothyroid 01/22/2015  . Hypothyroidism   . Osteopenia   . Raynauds syndrome   . Thyroid nodule   . Urinary incontinence 02/23/2013   Past Surgical History:  Procedure Laterality Date  . BACK SURGERY     3 surg-last with fusion and rods  . BACK SURGERY    . COLONOSCOPY N/A 03/16/2012   Procedure: COLONOSCOPY;  Surgeon: Rogene Houston, MD;  Location: [facility];  Service: Endoscopy;  Laterality: N/A;  730  . DIAGNOSTIC LAPAROSCOPY     dx  . FOOT TENDON SURGERY    . TUBAL LIGATION     Social History   Socioeconomic History  . Marital status: Married    Spouse name: Gwyndolyn Saxon  . Number of children: 2  . Years of education: BA  . Highest education level: Not on file  Occupational History    Employer: OTHER    Comment: disabled  Social Needs  . Financial resource strain: Not on file  . Food insecurity:    Worry: Not on file    Inability: Not on file  . Transportation needs:    Medical: Not on file    Non-medical: Not on file  Tobacco Use  . Smoking status: Never Smoker  . Smokeless tobacco: Never Used  Substance and Sexual Activity  . Alcohol use: Yes    Alcohol/week: 2.4 oz    Types: 4 Glasses of wine per week    Comment: wine 2-3 glasses 2-3 days out of the week  . Drug use: No  . Sexual activity: Yes    Birth control/protection: Post-menopausal, Surgical    Comment: tubal  Lifestyle  . Physical activity:    Days per week: Not on file    Minutes per session: Not on file  . Stress: Not on file  Relationships  . Social connections:    Talks on phone: Not on file    Gets together: Not on file    Attends  religious service: Not on file    Active member of club or organization: Not on file    Attends meetings of clubs or organizations: Not on file    Relationship status: Not on file  Other Topics Concern  . Not on file  Social History Narrative   Patient lives at home with spouse.   Caffeine Use: 3 Mt. Dew's 12oz. can daily   Outpatient Encounter Medications as of 09/02/2017  Medication Sig  . Cholecalciferol 2000 units CAPS Take 2,000 capsules by mouth daily with breakfast.  . promethazine (PHENERGAN) 25 MG tablet Take 25 mg by mouth every 8 (eight) hours as needed for nausea or vomiting.  Marland Kitchen HYDROcodone-acetaminophen (NORCO/VICODIN) 5-325 MG per tablet Take 1 tablet by mouth every 6 (six) hours as needed. Pain, usually takes a benadryl wit it to control itching.  . latanoprost (XALATAN) 0.005 % ophthalmic solution Place 1 drop into both eyes at bedtime.  Marland Kitchen levothyroxine (SYNTHROID, LEVOTHROID) 50 MCG tablet TAKE ONE TABLET BY MOUTH EVERY DAY 30 MINUTES BEFORE BREAKFAST  . lisinopril (PRINIVIL,ZESTRIL) 10  MG tablet Take 10 mg by mouth daily.  Marland Kitchen LORazepam (ATIVAN) 0.5 MG tablet Take by mouth.  . pantoprazole (PROTONIX) 40 MG tablet   . zolpidem (AMBIEN) 5 MG tablet Take 5 mg by mouth at bedtime as needed for sleep.  . [DISCONTINUED] Cholecalciferol (VITAMIN D3) 5000 units CAPS Take 1 capsule (5,000 Units total) by mouth daily.  . [DISCONTINUED] levothyroxine (SYNTHROID, LEVOTHROID) 50 MCG tablet TAKE ONE TABLET BY MOUTH EVERY DAY 30 MINUTES BEFORE BREAKFAST  . [DISCONTINUED] LORazepam (ATIVAN) 0.5 MG tablet Take 0.5 mg by mouth 3 (three) times daily as needed. Anxiety  . [DISCONTINUED] nystatin (MYCOSTATIN/NYSTOP) powder Apply topically 3 (three) times daily.  . [DISCONTINUED] valACYclovir (VALTREX) 1000 MG tablet TAKE TWO TABLETS TODAY AND 2 TABLETS IN THE MORNING FOR COLD SORES ASNEEDED  . [DISCONTINUED] venlafaxine XR (EFFEXOR-XR) 75 MG 24 hr capsule 75 mg daily.    No  facility-administered encounter medications on file as of 09/02/2017.    ALLERGIES: Allergies  Allergen Reactions  . Demerol [Meperidine] Itching  . Lortab [Hydrocodone-Acetaminophen] Itching  . Morphine And Related Itching  . Percocet [Oxycodone-Acetaminophen] Itching   VACCINATION STATUS:  There is no immunization history on file for this patient.  HPI  62 year old female patient with medical history as above. She is returning for follow-up for hypothyroidism, osteopenia.    She was put on levothyroxine 50 mcg p.o. nightly.  She is compliant.  She has no new complaints today.     -  She did have thyroid ultrasound in 2010 which showed right lobe of thyroid was 4.3 cm and left lobe was 3.6 cm with no discrete nodules. -She lost 5 pounds of body weight lately.  She missed her appointment for bone density.  No interval fracture.  She gives a remote history of stress fracture.   Review of Systems  Constitutional:  + lost weight, no subjective hyperthermia/hypothermia Eyes: no blurry vision, no xerophthalmia ENT: no sore throat, no nodules palpated in throat, no dysphagia/odynophagia, + hoarseness Cardiovascular: no pain, no palpitations.  Respiratory: no cough/SOB Gastrointestinal: no N/V/D/C Musculoskeletal: no muscle/joint aches Skin: no rashes Neurological: no tremors/numbness/tingling/dizziness Psychiatric: no depression/anxiety  Objective:    BP 113/83   Pulse 96   Ht 5' 6.25" (1.683 m)   Wt 189 lb (85.7 kg)   BMI 30.28 kg/m   Wt Readings from Last 3 Encounters:  09/02/17 189 lb (85.7 kg)  03/22/17 194 lb (88 kg)  03/31/16 195 lb (88.5 kg)    Physical Exam   Constitutional: + Obese , not in acute distress.   Eyes: PERRLA, EOMI, no exophthalmos ENT: moist mucous membranes, no thyromegaly, no cervical lymphadenopathy Musculoskeletal: no deformities, strength intact in all 4 Skin: moist, warm, no rashes Neurological: no tremor with outstretched hands, DTR normal  in all 4   Recent Results (from the past 2160 hour(s))  T4, Free     Status: None   Collection Time: 08/26/17 11:17 AM  Result Value Ref Range   Free T4 1.1 0.8 - 1.8 ng/dL  TSH     Status: None   Collection Time: 08/26/17 11:17 AM  Result Value Ref Range   TSH 3.10 0.40 - 4.50 mIU/L     Assessment & Plan:   1.  Hypothyroidism - Her repeat thyroid function tests are suggestive of appropriate replacement with levothyroxine. -I advised her to continue levothyroxine  50 g of levothyroxine by mouth every morning.  - We discussed about correct intake of levothyroxine, at fasting, with water,  separated by at least 30 minutes from breakfast, and separated by more than 4 hours from calcium, iron, multivitamins, acid reflux medications (PPIs). -Patient is made aware of the fact that thyroid hormone replacement is needed for life, dose to be adjusted by periodic monitoring of thyroid function tests.  - Based on a 2010 thyroid ultrasound she does not have nodular lesions, however it may be necessary to repeat thyroid ultrasound before her next visit in 6 month.  2. Osteopenia/Vitamin D deficiency:  Her most recent DEXA scan and March 2017 showed:  DualFemur Neck Left 05/01/2015 57.2 Osteopenia -1.1 0.890 g/cm2 Left Forearm Radius 33% 05/01/2015 57.2 Normal -1.0 0.645 g/cm2  - Although this is not possible to compare with her prior DEXA report  (Her T score was -2.2 in March 2015) , this puts her in the osteopenia category. No clear evidence of trivial fracture. She will not need bisphosphonate therapy for now.  She  is a status post therapy with vitamin D 50,000 units for 8 weeks. I advised her to continue vitamin D 2000 units daily. -She will have repeat bone density before her next visit.  - I advised patient to maintain close follow up with Asencion Noble, MD for primary care needs.  Follow up plan: Return in about 6 months (around 03/05/2018) for follow up with Bone Density, follow up with  pre-visit labs.  Glade Lloyd, MD Phone: [phone]  Fax: [phone]   -  This note was partially dictated with voice recognition software. Similar sounding words can be transcribed inadequately or may not  be corrected upon review.  09/02/2017, 4:38 PM

## 2017-09-22 ENCOUNTER — Ambulatory Visit (HOSPITAL_COMMUNITY)
Admission: RE | Admit: 2017-09-22 | Discharge: 2017-09-22 | Disposition: A | Discharge status: Home / Self Care | Payer: Medicare Other | Source: Ambulatory Visit | Attending: "Endocrinology | Admitting: "Endocrinology

## 2017-09-22 DIAGNOSIS — M85852 Other specified disorders of bone density and structure, left thigh: Secondary | ICD-10-CM | POA: Diagnosis not present

## 2017-09-22 DIAGNOSIS — Z780 Asymptomatic menopausal state: Secondary | ICD-10-CM | POA: Diagnosis not present

## 2017-09-22 DIAGNOSIS — M8580 Other specified disorders of bone density and structure, unspecified site: Secondary | ICD-10-CM

## 2017-09-22 DIAGNOSIS — E039 Hypothyroidism, unspecified: Secondary | ICD-10-CM | POA: Insufficient documentation

## 2017-09-22 DIAGNOSIS — E559 Vitamin D deficiency, unspecified: Secondary | ICD-10-CM | POA: Insufficient documentation

## 2017-09-22 DIAGNOSIS — M85851 Other specified disorders of bone density and structure, right thigh: Secondary | ICD-10-CM | POA: Diagnosis not present

## 2017-09-22 DIAGNOSIS — Z79899 Other long term (current) drug therapy: Secondary | ICD-10-CM | POA: Diagnosis not present

## 2017-10-05 DIAGNOSIS — K219 Gastro-esophageal reflux disease without esophagitis: Secondary | ICD-10-CM | POA: Diagnosis not present

## 2017-10-05 DIAGNOSIS — R030 Elevated blood-pressure reading, without diagnosis of hypertension: Secondary | ICD-10-CM | POA: Diagnosis not present

## 2017-10-06 DIAGNOSIS — M47816 Spondylosis without myelopathy or radiculopathy, lumbar region: Secondary | ICD-10-CM | POA: Diagnosis not present

## 2017-11-18 ENCOUNTER — Other Ambulatory Visit (HOSPITAL_COMMUNITY): Payer: Self-pay | Admitting: Internal Medicine

## 2017-11-18 DIAGNOSIS — Z1231 Encounter for screening mammogram for malignant neoplasm of breast: Secondary | ICD-10-CM

## 2017-11-29 ENCOUNTER — Ambulatory Visit (HOSPITAL_COMMUNITY): Payer: Medicare Other

## 2017-12-06 ENCOUNTER — Ambulatory Visit (HOSPITAL_COMMUNITY): Payer: Medicare Other

## 2017-12-30 DIAGNOSIS — M47816 Spondylosis without myelopathy or radiculopathy, lumbar region: Secondary | ICD-10-CM | POA: Diagnosis not present

## 2018-01-21 DIAGNOSIS — Z23 Encounter for immunization: Secondary | ICD-10-CM | POA: Diagnosis not present

## 2018-01-21 DIAGNOSIS — R030 Elevated blood-pressure reading, without diagnosis of hypertension: Secondary | ICD-10-CM | POA: Diagnosis not present

## 2018-01-21 DIAGNOSIS — K219 Gastro-esophageal reflux disease without esophagitis: Secondary | ICD-10-CM | POA: Diagnosis not present

## 2018-01-27 ENCOUNTER — Ambulatory Visit (HOSPITAL_COMMUNITY): Payer: Medicare Other

## 2018-02-07 ENCOUNTER — Ambulatory Visit (HOSPITAL_COMMUNITY): Payer: Medicare Other

## 2018-02-24 ENCOUNTER — Other Ambulatory Visit: Payer: Self-pay | Admitting: Adult Health

## 2018-02-25 DIAGNOSIS — E559 Vitamin D deficiency, unspecified: Secondary | ICD-10-CM | POA: Diagnosis not present

## 2018-02-25 DIAGNOSIS — E039 Hypothyroidism, unspecified: Secondary | ICD-10-CM | POA: Diagnosis not present

## 2018-02-26 LAB — TSH: TSH: 4.25 mIU/L (ref 0.40–4.50)

## 2018-02-26 LAB — VITAMIN D 25 HYDROXY (VIT D DEFICIENCY, FRACTURES): Vit D, 25-Hydroxy: 33 ng/mL (ref 30–100)

## 2018-02-26 LAB — T4, FREE: FREE T4: 1.1 ng/dL (ref 0.8–1.8)

## 2018-03-07 ENCOUNTER — Ambulatory Visit: Payer: Medicare Other | Admitting: "Endocrinology

## 2018-03-24 ENCOUNTER — Ambulatory Visit (INDEPENDENT_AMBULATORY_CARE_PROVIDER_SITE_OTHER): Payer: Medicare Other | Admitting: "Endocrinology

## 2018-03-24 ENCOUNTER — Encounter: Payer: Self-pay | Admitting: "Endocrinology

## 2018-03-24 VITALS — BP 116/82 | HR 109 | Ht 67.0 in | Wt 191.0 lb

## 2018-03-24 DIAGNOSIS — E039 Hypothyroidism, unspecified: Secondary | ICD-10-CM | POA: Diagnosis not present

## 2018-03-24 MED ORDER — LEVOTHYROXINE SODIUM 50 MCG PO TABS: ORAL_TABLET | ORAL | 1 refills | Status: DC

## 2018-03-24 NOTE — Progress Notes (Signed)
LeighAnn Vue Pavon, CMA  

## 2018-03-24 NOTE — Progress Notes (Signed)
Endocrinology follow-up note Subjective:    Patient ID: Heather Gill, female    DOB: 07/29/1955, PCP Asencion Noble, MD   Past Medical History:  Diagnosis Date  . Anxiety   . Blood in urine    Dr Maryland Pink told her 6 yrs ago-no problems  . Depression   . Glaucoma   . Hormone replacement therapy (HRT)   . Hx of spinal fusion   . Hypertension   . Hypothyroid 01/22/2015  . Hypothyroidism   . Osteopenia   . Raynauds syndrome   . Thyroid nodule   . Urinary incontinence 02/23/2013   Past Surgical History:  Procedure Laterality Date  . BACK SURGERY     3 surg-last with fusion and rods  . BACK SURGERY    . COLONOSCOPY N/A 03/16/2012   Procedure: COLONOSCOPY;  Surgeon: Rogene Houston, MD;  Location: [facility];  Service: Endoscopy;  Laterality: N/A;  730  . DIAGNOSTIC LAPAROSCOPY     dx  . FOOT TENDON SURGERY    . TUBAL LIGATION     Social History   Socioeconomic History  . Marital status: Married    Spouse name: Gwyndolyn Saxon  . Number of children: 2  . Years of education: BA  . Highest education level: Not on file  Occupational History    Employer: OTHER    Comment: disabled  Social Needs  . Financial resource strain: Not on file  . Food insecurity:    Worry: Not on file    Inability: Not on file  . Transportation needs:    Medical: Not on file    Non-medical: Not on file  Tobacco Use  . Smoking status: Never Smoker  . Smokeless tobacco: Never Used  Substance and Sexual Activity  . Alcohol use: Yes    Alcohol/week: 4.0 standard drinks    Types: 4 Glasses of wine per week    Comment: wine 2-3 glasses 2-3 days out of the week  . Drug use: No  . Sexual activity: Yes    Birth control/protection: Post-menopausal, Surgical    Comment: tubal  Lifestyle  . Physical activity:    Days per week: Not on file    Minutes per session: Not on file  . Stress: Not on file  Relationships  . Social connections:    Talks on phone: Not on file    Gets together: Not on file   Attends religious service: Not on file    Active member of club or organization: Not on file    Attends meetings of clubs or organizations: Not on file    Relationship status: Not on file  Other Topics Concern  . Not on file  Social History Narrative   Patient lives at home with spouse.   Caffeine Use: 3 Mt. Dew's 12oz. can daily   Outpatient Encounter Medications as of 03/24/2018  Medication Sig  . HYDROcodone-acetaminophen (NORCO/VICODIN) 5-325 MG per tablet Take 1 tablet by mouth every 6 (six) hours as needed. Pain, usually takes a benadryl wit it to control itching.  . latanoprost (XALATAN) 0.005 % ophthalmic solution Place 1 drop into both eyes at bedtime.  Marland Kitchen levothyroxine (SYNTHROID, LEVOTHROID) 50 MCG tablet TAKE ONE TABLET BY MOUTH EVERY DAY 30 MINUTES BEFORE BREAKFAST  . promethazine (PHENERGAN) 25 MG tablet Take 25 mg by mouth every 8 (eight) hours as needed for nausea or vomiting.  . valACYclovir (VALTREX) 1000 MG tablet TAKE TWO TABLETS BY MOUTH TODAY AND TWO TABLETS IN THE MORNING FOR  COLD SORES ASNEEDED  . zolpidem (AMBIEN) 5 MG tablet Take 5 mg by mouth at bedtime as needed for sleep.  . [DISCONTINUED] Cholecalciferol 2000 units CAPS Take 2,000 capsules by mouth daily with breakfast.  . [DISCONTINUED] levothyroxine (SYNTHROID, LEVOTHROID) 50 MCG tablet TAKE ONE TABLET BY MOUTH EVERY DAY 30 MINUTES BEFORE BREAKFAST  . [DISCONTINUED] lisinopril (PRINIVIL,ZESTRIL) 10 MG tablet Take 10 mg by mouth daily.  . [DISCONTINUED] LORazepam (ATIVAN) 0.5 MG tablet Take by mouth.  . [DISCONTINUED] pantoprazole (PROTONIX) 40 MG tablet    No facility-administered encounter medications on file as of 03/24/2018.    ALLERGIES: Allergies  Allergen Reactions  . Demerol [Meperidine] Itching  . Lortab [Hydrocodone-Acetaminophen] Itching  . Morphine And Related Itching  . Percocet [Oxycodone-Acetaminophen] Itching   VACCINATION STATUS:  There is no immunization history on file for this  patient.  HPI  63 year old female patient with medical history as above.  She is returning for follow-up of hypothyroidism.   -She is on levothyroxine 50 mcg p.o. nightly.  She is compliant to her medications.  She has no new complaints.  Her interim bone density reports normal.   -She denies palpitations, tremors, nor heat intolerance. -Her pulse rate today was found to be above normal at 109.  -  She did have thyroid ultrasound in 2010 which showed right lobe of thyroid was 4.3 cm and left lobe was 3.6 cm with no discrete nodules. -Her repeat bone density in August 2019 was reported as normal.  She gives a remote history of stress fracture.   Review of Systems  Constitutional:  + steady weight, no subjective hyperthermia/hypothermia Eyes: no blurry vision, no xerophthalmia ENT: no sore throat, no nodules palpated in throat, no dysphagia/odynophagia, + hoarseness Cardiovascular: no pain, no palpitations.  Musculoskeletal: no muscle/joint aches Skin: no rashes Neurological: no tremors/numbness/tingling/dizziness Psychiatric: no depression/anxiety  Objective:    BP 116/82   Pulse (!) 109   Ht 5\' 7"  (1.702 m)   Wt 191 lb (86.6 kg)   BMI 29.91 kg/m   Wt Readings from Last 3 Encounters:  03/24/18 191 lb (86.6 kg)  09/02/17 189 lb (85.7 kg)  03/22/17 194 lb (88 kg)    Physical Exam   Constitutional: + Obese , not in acute distress.   Eyes: PERRLA, EOMI, no exophthalmos ENT: moist mucous membranes, no thyromegaly, no cervical lymphadenopathy CVS: + Tachycardic. Musculoskeletal: no deformities, strength intact in all 4 Skin: moist, warm, no rashes Neurological: no tremor with outstretched hands, DTR normal in all 4   Recent Results (from the past 2160 hour(s))  TSH     Status: None   Collection Time: 02/25/18  9:22 AM  Result Value Ref Range   TSH 4.25 0.40 - 4.50 mIU/L  T4, free     Status: None   Collection Time: 02/25/18  9:22 AM  Result Value Ref Range   Free T4 1.1  0.8 - 1.8 ng/dL  VITAMIN D 25 Hydroxy (Vit-D Deficiency, Fractures)     Status: None   Collection Time: 02/25/18  9:22 AM  Result Value Ref Range   Vit D, 25-Hydroxy 33 30 - 100 ng/mL    Comment: Vitamin D Status         25-OH Vitamin D: . Deficiency:                    <20 ng/mL Insufficiency:             20 - 29 ng/mL Optimal:                 >  or = 30 ng/mL . For 25-OH Vitamin D testing on patients on  D2-supplementation and patients for whom quantitation  of D2 and D3 fractions is required, the [facility](TM) 25-OH VIT D, (D2,D3), LC/MS/MS is recommended: order  code [id number] (patients >77yrs). . For more information on this test, go to: http://education.[facility].com/faq/FAQ163 (This link is being provided for  informational/educational purposes only.)      Assessment & Plan:   1.  Hypothyroidism - Her previsit thyroid function tests are consistent with appropriate replacement.  -She would benefit from slight increase in her levothyroxine, however will not tolerate given her current tachycardia.  She is advised to obtain an EKG during her visit with Dr. Willey Blade.   She is advised to continue levothyroxine 50 mcg p.o. every morning.    - We discussed about the correct intake of her thyroid hormone, on empty stomach at fasting, with water, separated by at least 30 minutes from breakfast and other medications,  and separated by more than 4 hours from calcium, iron, multivitamins, acid reflux medications (PPIs). -Patient is made aware of the fact that thyroid hormone replacement is needed for life, dose to be adjusted by periodic monitoring of thyroid function tests.   - Based on a 2010 thyroid ultrasound she does not have nodular lesions, however it may be necessary to repeat thyroid ultrasound in 6 months.    - I advised patient to maintain close follow up with Asencion Noble, MD for primary care needs.  Follow up plan: Return in about 3 months (around 06/22/2018) for Follow  up with Pre-visit Labs.  Glade Lloyd, MD Phone: [phone]  Fax: [phone]   -  This note was partially dictated with voice recognition software. Similar sounding words can be transcribed inadequately or may not  be corrected upon review.  03/24/2018, 3:25 PM

## 2018-03-29 DIAGNOSIS — M47816 Spondylosis without myelopathy or radiculopathy, lumbar region: Secondary | ICD-10-CM | POA: Diagnosis not present

## 2018-06-22 ENCOUNTER — Ambulatory Visit: Payer: Medicare Other | Admitting: "Endocrinology

## 2018-07-13 ENCOUNTER — Ambulatory Visit: Payer: Medicare Other | Admitting: "Endocrinology

## 2018-07-22 DIAGNOSIS — M4306 Spondylolysis, lumbar region: Secondary | ICD-10-CM | POA: Diagnosis not present

## 2018-07-22 DIAGNOSIS — R030 Elevated blood-pressure reading, without diagnosis of hypertension: Secondary | ICD-10-CM | POA: Diagnosis not present

## 2018-08-03 ENCOUNTER — Ambulatory Visit: Payer: Medicare Other | Admitting: "Endocrinology

## 2018-08-30 DIAGNOSIS — E559 Vitamin D deficiency, unspecified: Secondary | ICD-10-CM | POA: Diagnosis not present

## 2018-08-30 DIAGNOSIS — E039 Hypothyroidism, unspecified: Secondary | ICD-10-CM | POA: Diagnosis not present

## 2018-08-31 LAB — TSH: TSH: 2.95 mIU/L (ref 0.40–4.50)

## 2018-08-31 LAB — T4, FREE: Free T4: 1 ng/dL (ref 0.8–1.8)

## 2018-08-31 LAB — VITAMIN D 25 HYDROXY (VIT D DEFICIENCY, FRACTURES): Vit D, 25-Hydroxy: 49 ng/mL (ref 30–100)

## 2018-09-06 ENCOUNTER — Other Ambulatory Visit: Payer: Self-pay

## 2018-09-06 ENCOUNTER — Encounter: Payer: Self-pay | Admitting: "Endocrinology

## 2018-09-06 ENCOUNTER — Ambulatory Visit (INDEPENDENT_AMBULATORY_CARE_PROVIDER_SITE_OTHER): Payer: Medicare Other | Admitting: "Endocrinology

## 2018-09-06 DIAGNOSIS — E039 Hypothyroidism, unspecified: Secondary | ICD-10-CM | POA: Diagnosis not present

## 2018-09-06 DIAGNOSIS — E049 Nontoxic goiter, unspecified: Secondary | ICD-10-CM | POA: Diagnosis not present

## 2018-09-06 NOTE — Progress Notes (Signed)
09/06/2018                                  Endocrinology Telehealth Visit Follow up Note -During COVID -19 Pandemic  I connected with Heather Gill on 09/06/2018   by telephone and verified that I am speaking with the correct person using two identifiers. Heather Gill, [birth year]. she has verbally consented to this visit. All issues noted in this document were discussed and addressed. The format was not optimal for physical exam.  Subjective:    Patient ID: Heather Gill, female    DOB: Dec 07, 1957, PCP Asencion Noble, MD   Past Medical History:  Diagnosis Date  . Anxiety   . Blood in urine    Dr Maryland Pink told her 6 yrs ago-no problems  . Depression   . Glaucoma   . Hormone replacement therapy (HRT)   . Hx of spinal fusion   . Hypertension   . Hypothyroid 01/22/2015  . Hypothyroidism   . Osteopenia   . Raynauds syndrome   . Thyroid nodule   . Urinary incontinence 02/23/2013   Past Surgical History:  Procedure Laterality Date  . BACK SURGERY     3 surg-last with fusion and rods  . BACK SURGERY    . COLONOSCOPY N/A 03/16/2012   Procedure: COLONOSCOPY;  Surgeon: Rogene Houston, MD;  Location: [facility];  Service: Endoscopy;  Laterality: N/A;  730  . DIAGNOSTIC LAPAROSCOPY     dx  . FOOT TENDON SURGERY    . TUBAL LIGATION     Social History   Socioeconomic History  . Marital status: Married    Spouse name: Gwyndolyn Saxon  . Number of children: 2  . Years of education: BA  . Highest education level: Not on file  Occupational History    Employer: OTHER    Comment: disabled  Social Needs  . Financial resource strain: Not on file  . Food insecurity    Worry: Not on file    Inability: Not on file  . Transportation needs    Medical: Not on file    Non-medical: Not on file  Tobacco Use  . Smoking status: Never Smoker  . Smokeless tobacco: Never Used  Substance and Sexual Activity  . Alcohol use: Yes    Alcohol/week: 4.0 standard drinks    Types: 4 Glasses of wine per  week    Comment: wine 2-3 glasses 2-3 days out of the week  . Drug use: No  . Sexual activity: Yes    Birth control/protection: Post-menopausal, Surgical    Comment: tubal  Lifestyle  . Physical activity    Days per week: Not on file    Minutes per session: Not on file  . Stress: Not on file  Relationships  . Social Herbalist on phone: Not on file    Gets together: Not on file    Attends religious service: Not on file    Active member of club or organization: Not on file    Attends meetings of clubs or organizations: Not on file    Relationship status: Not on file  Other Topics Concern  . Not on file  Social History Narrative   Patient lives at home with spouse.   Caffeine Use: 3 Mt. Dew's 12oz. can daily   Outpatient Encounter Medications as of 09/06/2018  Medication Sig  . HYDROcodone-acetaminophen (NORCO/VICODIN) 5-325 MG per tablet  Take 1 tablet by mouth every 6 (six) hours as needed. Pain, usually takes a benadryl wit it to control itching.  . latanoprost (XALATAN) 0.005 % ophthalmic solution Place 1 drop into both eyes at bedtime.  Marland Kitchen levothyroxine (SYNTHROID, LEVOTHROID) 50 MCG tablet TAKE ONE TABLET BY MOUTH EVERY DAY 30 MINUTES BEFORE BREAKFAST  . promethazine (PHENERGAN) 25 MG tablet Take 25 mg by mouth every 8 (eight) hours as needed for nausea or vomiting.  . valACYclovir (VALTREX) 1000 MG tablet TAKE TWO TABLETS BY MOUTH TODAY AND TWO TABLETS IN THE MORNING FOR COLD SORES ASNEEDED  . zolpidem (AMBIEN) 5 MG tablet Take 5 mg by mouth at bedtime as needed for sleep.   No facility-administered encounter medications on file as of 09/06/2018.    ALLERGIES: Allergies  Allergen Reactions  . Demerol [Meperidine] Itching  . Lortab [Hydrocodone-Acetaminophen] Itching  . Morphine And Related Itching  . Percocet [Oxycodone-Acetaminophen] Itching   VACCINATION STATUS:  There is no immunization history on file for this patient.  HPI  63 year old female patient  with medical history as above.  She is returning for follow-up of hypothyroidism.   -She remains on levothyroxine 50 mcg p.o. daily before breakfast.  She is compliant to her medications.  She has no new complaints.  Her interim bone density reports normal.   -She denies palpitations, tremors, nor heat intolerance. -Her pulse rate today was found to be above normal at 109.  -  She did have thyroid ultrasound in 2010 which showed right lobe of thyroid was 4.3 cm and left lobe was 3.6 cm with no discrete nodules. -Her repeat bone density in August 2019 was reported as normal.  She gives a remote history of stress fracture.   Review of Systems  Limited as above  Objective:    There were no vitals taken for this visit.  Wt Readings from Last 3 Encounters:  03/24/18 191 lb (86.6 kg)  09/02/17 189 lb (85.7 kg)  03/22/17 194 lb (88 kg)    Physical Exam   Recent Results (from the past 2160 hour(s))  TSH     Status: None   Collection Time: 08/30/18  9:27 AM  Result Value Ref Range   TSH 2.95 0.40 - 4.50 mIU/L  T4, free     Status: None   Collection Time: 08/30/18  9:27 AM  Result Value Ref Range   Free T4 1.0 0.8 - 1.8 ng/dL  VITAMIN D 25 Hydroxy (Vit-D Deficiency, Fractures)     Status: None   Collection Time: 08/30/18  9:27 AM  Result Value Ref Range   Vit D, 25-Hydroxy 49 30 - 100 ng/mL    Comment: Vitamin D Status         25-OH Vitamin D: . Deficiency:                    <20 ng/mL Insufficiency:             20 - 29 ng/mL Optimal:                 > or = 30 ng/mL . For 25-OH Vitamin D testing on patients on  D2-supplementation and patients for whom quantitation  of D2 and D3 fractions is required, the [facility](TM) 25-OH VIT D, (D2,D3), LC/MS/MS is recommended: order  code [id number] (patients >39yrs). See Note 1 . Note 1 . For additional information, please refer to  http://education.[facility].com/faq/FAQ199  (This link is being provided for  informational/ educational purposes only.)  Assessment & Plan:   1.  Hypothyroidism 2.  Nodular goiter - Her previsit thyroid function tests are consistent with appropriate replacement, however patient will benefit from slight increase in her levothyroxine dose.  I discussed and increased her levothyroxine to 75 mcg p.o. every morning.   - We discussed about the correct intake of her thyroid hormone, on empty stomach at fasting, with water, separated by at least 30 minutes from breakfast and other medications,  and separated by more than 4 hours from calcium, iron, multivitamins, acid reflux medications (PPIs). -Patient is made aware of the fact that thyroid hormone replacement is needed for life, dose to be adjusted by periodic monitoring of thyroid function tests.    - Based on a 2010 thyroid ultrasound she does not have nodular lesions, she will have repeat surveillance thyroid ultrasound before next visit in 6 months.    - I advised patient to maintain close follow up with Asencion Noble, MD for primary care needs.  Time for this visit: 15 minutes. Heather Gill  participated in the discussions, expressed understanding, and voiced agreement with the above plans.  All questions were answered to her satisfaction. she is encouraged to contact clinic should she have any questions or concerns prior to her return visit.  Follow up plan: Return in about 6 months (around 03/09/2019) for Follow up with Pre-visit Labs, Thyroid / Neck Ultrasound.  Glade Lloyd, MD Phone: [phone]  Fax: [phone]   -  This note was partially dictated with voice recognition software. Similar sounding words can be transcribed inadequately or may not  be corrected upon review.  09/06/2018, 7:05 PM

## 2018-10-07 ENCOUNTER — Other Ambulatory Visit (HOSPITAL_COMMUNITY): Payer: Self-pay | Admitting: Internal Medicine

## 2018-10-07 DIAGNOSIS — Z1231 Encounter for screening mammogram for malignant neoplasm of breast: Secondary | ICD-10-CM

## 2018-10-19 ENCOUNTER — Ambulatory Visit (HOSPITAL_COMMUNITY)
Admission: RE | Admit: 2018-10-19 | Discharge: 2018-10-19 | Disposition: A | Discharge status: Home / Self Care | Payer: Medicare Other | Source: Ambulatory Visit | Attending: Internal Medicine | Admitting: Internal Medicine

## 2018-10-19 ENCOUNTER — Other Ambulatory Visit: Payer: Self-pay

## 2018-10-19 DIAGNOSIS — Z1231 Encounter for screening mammogram for malignant neoplasm of breast: Secondary | ICD-10-CM | POA: Diagnosis not present

## 2018-10-24 ENCOUNTER — Other Ambulatory Visit: Payer: Self-pay | Admitting: "Endocrinology

## 2018-11-14 ENCOUNTER — Other Ambulatory Visit: Payer: Self-pay | Admitting: Adult Health

## 2018-11-18 DIAGNOSIS — H401132 Primary open-angle glaucoma, bilateral, moderate stage: Secondary | ICD-10-CM | POA: Diagnosis not present

## 2019-02-06 DIAGNOSIS — M5090 Cervical disc disorder, unspecified, unspecified cervical region: Secondary | ICD-10-CM | POA: Diagnosis not present

## 2019-02-06 DIAGNOSIS — K2100 Gastro-esophageal reflux disease with esophagitis, without bleeding: Secondary | ICD-10-CM | POA: Diagnosis not present

## 2019-02-06 DIAGNOSIS — Z79899 Other long term (current) drug therapy: Secondary | ICD-10-CM | POA: Diagnosis not present

## 2019-02-06 DIAGNOSIS — R030 Elevated blood-pressure reading, without diagnosis of hypertension: Secondary | ICD-10-CM | POA: Diagnosis not present

## 2019-02-13 DIAGNOSIS — M47816 Spondylosis without myelopathy or radiculopathy, lumbar region: Secondary | ICD-10-CM | POA: Diagnosis not present

## 2019-02-13 DIAGNOSIS — R7301 Impaired fasting glucose: Secondary | ICD-10-CM | POA: Diagnosis not present

## 2019-02-21 ENCOUNTER — Ambulatory Visit (HOSPITAL_COMMUNITY): Payer: Medicare Other

## 2019-03-14 ENCOUNTER — Ambulatory Visit: Payer: Medicare Other | Admitting: "Endocrinology

## 2019-03-31 ENCOUNTER — Ambulatory Visit (HOSPITAL_COMMUNITY)
Admission: RE | Admit: 2019-03-31 | Discharge: 2019-03-31 | Disposition: A | Discharge status: Home / Self Care | Payer: Medicare Other | Source: Ambulatory Visit | Attending: "Endocrinology | Admitting: "Endocrinology

## 2019-03-31 ENCOUNTER — Other Ambulatory Visit: Payer: Self-pay

## 2019-03-31 DIAGNOSIS — E049 Nontoxic goiter, unspecified: Secondary | ICD-10-CM | POA: Insufficient documentation

## 2019-04-06 ENCOUNTER — Telehealth: Payer: Self-pay | Admitting: "Endocrinology

## 2019-04-06 DIAGNOSIS — E039 Hypothyroidism, unspecified: Secondary | ICD-10-CM

## 2019-04-06 NOTE — Telephone Encounter (Signed)
Orders updated and sent to [facility].  

## 2019-04-06 NOTE — Telephone Encounter (Signed)
Can you update lab order and let me know and I will let pt know

## 2019-04-06 NOTE — Addendum Note (Signed)
Addended by: Ellin Saba on: 04/06/2019 01:34 PM   Modules accepted: Orders

## 2019-04-07 DIAGNOSIS — E039 Hypothyroidism, unspecified: Secondary | ICD-10-CM | POA: Diagnosis not present

## 2019-04-08 LAB — T4, FREE: Free T4: 1 ng/dL (ref 0.8–1.8)

## 2019-04-08 LAB — TSH: TSH: 3.4 mIU/L (ref 0.40–4.50)

## 2019-04-11 ENCOUNTER — Encounter: Payer: Self-pay | Admitting: "Endocrinology

## 2019-04-11 ENCOUNTER — Ambulatory Visit (INDEPENDENT_AMBULATORY_CARE_PROVIDER_SITE_OTHER): Payer: Medicare Other | Admitting: "Endocrinology

## 2019-04-11 ENCOUNTER — Telehealth: Payer: Self-pay

## 2019-04-11 DIAGNOSIS — E039 Hypothyroidism, unspecified: Secondary | ICD-10-CM | POA: Diagnosis not present

## 2019-04-11 MED ORDER — LEVOTHYROXINE SODIUM 75 MCG PO TABS: ORAL_TABLET | ORAL | 3 refills | Status: DC

## 2019-04-11 NOTE — Progress Notes (Signed)
04/11/2019                                  Endocrinology Telehealth Visit Follow up Note -During COVID -19 Pandemic  I connected with Heather Gill on 04/11/2019   by telephone and verified that I am speaking with the correct person using two identifiers. Heather Gill, [birth year]. she has verbally consented to this visit. All issues noted in this document were discussed and addressed. The format was not optimal for physical exam.  Subjective:    Patient ID: Heather Gill, female    DOB: Feb 23, 2058, PCP Asencion Noble, MD   Past Medical History:  Diagnosis Date  . Anxiety   . Blood in urine    Dr [place] Pink told her 6 yrs ago-no problems  . Depression   . Glaucoma   . Hormone replacement therapy (HRT)   . Hx of spinal fusion   . Hypertension   . Hypothyroid 01/22/2015  . Hypothyroidism   . Osteopenia   . Raynauds syndrome   . Thyroid nodule   . Urinary incontinence 02/23/2013   Past Surgical History:  Procedure Laterality Date  . BACK SURGERY     3 surg-last with fusion and rods  . BACK SURGERY    . COLONOSCOPY N/A 03/16/2012   Procedure: COLONOSCOPY;  Surgeon: Rogene Houston, MD;  Location: [facility];  Service: Endoscopy;  Laterality: N/A;  730  . DIAGNOSTIC LAPAROSCOPY     dx  . FOOT TENDON SURGERY    . TUBAL LIGATION     Social History   Socioeconomic History  . Marital status: Married    Spouse name: Gwyndolyn Saxon  . Number of children: 2  . Years of education: BA  . Highest education level: Not on file  Occupational History    Employer: OTHER    Comment: disabled  Tobacco Use  . Smoking status: Never Smoker  . Smokeless tobacco: Never Used  Substance and Sexual Activity  . Alcohol use: Yes    Alcohol/week: 4.0 standard drinks    Types: 4 Glasses of wine per week    Comment: wine 2-3 glasses 2-3 days out of the week  . Drug use: No  . Sexual activity: Yes    Birth control/protection: Post-menopausal, Surgical    Comment: tubal  Other Topics Concern  . Not on  file  Social History Narrative   Patient lives at home with spouse.   Caffeine Use: 3 Mt. Dew's 12oz. can daily   Social Determinants of Health   Financial Resource Strain:   . Difficulty of Paying Living Expenses: Not on file  Food Insecurity:   . Worried About Charity fundraiser in the Last Year: Not on file  . Ran Out of Food in the Last Year: Not on file  Transportation Needs:   . Lack of Transportation (Medical): Not on file  . Lack of Transportation (Non-Medical): Not on file  Physical Activity:   . Days of Exercise per Week: Not on file  . Minutes of Exercise per Session: Not on file  Stress:   . Feeling of Stress : Not on file  Social Connections:   . Frequency of Communication with Friends and Family: Not on file  . Frequency of Social Gatherings with Friends and Family: Not on file  . Attends Religious Services: Not on file  . Active Member of Clubs or Organizations: Not on file  .  Attends Archivist Meetings: Not on file  . Marital Status: Not on file   Outpatient Encounter Medications as of 04/11/2019  Medication Sig  . HYDROcodone-acetaminophen (NORCO/VICODIN) 5-325 MG per tablet Take 1 tablet by mouth every 6 (six) hours as needed. Pain, usually takes a benadryl wit it to control itching.  . latanoprost (XALATAN) 0.005 % ophthalmic solution Place 1 drop into both eyes at bedtime.  Marland Kitchen levothyroxine (SYNTHROID) 75 MCG tablet As directed.  . nystatin (MYCOSTATIN/NYSTOP) powder APPLY TOPICALLY THREE TIMES DAILY  . promethazine (PHENERGAN) 25 MG tablet Take 25 mg by mouth every 8 (eight) hours as needed for nausea or vomiting.  . valACYclovir (VALTREX) 1000 MG tablet TAKE TWO TABLETS BY MOUTH TODAY AND TWO TABLETS IN THE MORNING FOR COLD SORES ASNEEDED  . zolpidem (AMBIEN) 5 MG tablet Take 5 mg by mouth at bedtime as needed for sleep.  . [DISCONTINUED] levothyroxine (SYNTHROID) 50 MCG tablet TAKE ONE TABLET BY MOUTH EVERY DAY 30 MINUTES BEFORE BREAKFAST   No  facility-administered encounter medications on file as of 04/11/2019.   ALLERGIES: Allergies  Allergen Reactions  . Demerol [Meperidine] Itching  . Lortab [Hydrocodone-Acetaminophen] Itching  . Morphine And Related Itching  . Percocet [Oxycodone-Acetaminophen] Itching   VACCINATION STATUS:  There is no immunization history on file for this patient.  HPI  64 year old female patient with medical history as above.  She is returning for follow-up of hypothyroidism.   -She remains on levothyroxine 50 mcg p.o. daily before breakfast.  She reports compliance to her medication.  She has no new complaints.    -She denies palpitations, tremors, nor heat intolerance. -Her pulse rate today was found to be above normal at 109.  -  She did have thyroid ultrasound in 2010 which showed right lobe of thyroid was 4.3 cm and left lobe was 3.6 cm with no discrete nodules. -Her repeat bone density in August 2019 was reported as normal.  She gives a remote history of stress fracture.   Review of Systems  Limited as above  Objective:    There were no vitals taken for this visit.  Wt Readings from Last 3 Encounters:  03/24/18 191 lb (86.6 kg)  09/02/17 189 lb (85.7 kg)  03/22/17 194 lb (88 kg)    Physical Exam   Recent Results (from the past 2160 hour(s))  TSH     Status: None   Collection Time: 04/07/19 11:34 AM  Result Value Ref Range   TSH 3.40 0.40 - 4.50 mIU/L  T4, free     Status: None   Collection Time: 04/07/19 11:34 AM  Result Value Ref Range   Free T4 1.0 0.8 - 1.8 ng/dL     Assessment & Plan:   1.  Hypothyroidism 2.  Nodular goiter - Her previsit thyroid function tests are such that she would benefit from slight increase in her levothyroxine.  I discussed and increased her levothyroxine to 75 mcg p.o. daily before breakfast.     - We discussed about the correct intake of her thyroid hormone, on empty stomach at fasting, with water, separated by at least 30 minutes from  breakfast and other medications,  and separated by more than 4 hours from calcium, iron, multivitamins, acid reflux medications (PPIs). -Patient is made aware of the fact that thyroid hormone replacement is needed for life, dose to be adjusted by periodic monitoring of thyroid function tests.    - Based on a 2010 thyroid ultrasound she does not have nodular  lesions, she will have repeat surveillance thyroid ultrasound after her next visit.     - I advised patient to maintain close follow up with Asencion Noble, MD for primary care needs.     - Time spent on this patient care encounter:  20 minutes of which 50% was spent in  counseling and the rest reviewing  her current and  previous labs / studies and medications  doses and developing a plan for long term care. Heather Gill  participated in the discussions, expressed understanding, and voiced agreement with the above plans.  All questions were answered to her satisfaction. she is encouraged to contact clinic should she have any questions or concerns prior to her return visit.   Follow up plan: Return in about 4 months (around 08/11/2019) for Follow up with Pre-visit Labs.  Glade Lloyd, MD Phone: [phone]  Fax: [phone]   -  This note was partially dictated with voice recognition software. Similar sounding words can be transcribed inadequately or may not  be corrected upon review.  04/11/2019, 5:00 PM

## 2019-04-11 NOTE — Telephone Encounter (Signed)
Called Tammy at [facility] to confirm change to levothyroxine.

## 2019-04-11 NOTE — Telephone Encounter (Signed)
Please call Tammy regarding [id number]

## 2019-05-08 ENCOUNTER — Ambulatory Visit: Payer: Medicare Other | Attending: Internal Medicine

## 2019-05-08 ENCOUNTER — Other Ambulatory Visit: Payer: Self-pay

## 2019-05-08 DIAGNOSIS — Z20822 Contact with and (suspected) exposure to covid-19: Secondary | ICD-10-CM

## 2019-05-10 LAB — NOVEL CORONAVIRUS, NAA: SARS-CoV-2, NAA: NOT DETECTED

## 2019-05-10 LAB — SARS-COV-2, NAA 2 DAY TAT

## 2019-07-06 ENCOUNTER — Other Ambulatory Visit: Payer: Medicare Other | Admitting: Adult Health

## 2019-08-17 ENCOUNTER — Ambulatory Visit: Payer: Medicare Other | Admitting: "Endocrinology

## 2019-09-07 ENCOUNTER — Ambulatory Visit: Payer: Medicare Other | Admitting: Nurse Practitioner

## 2019-09-11 DIAGNOSIS — M47816 Spondylosis without myelopathy or radiculopathy, lumbar region: Secondary | ICD-10-CM | POA: Diagnosis not present

## 2019-09-11 DIAGNOSIS — G4700 Insomnia, unspecified: Secondary | ICD-10-CM | POA: Diagnosis not present

## 2019-09-14 DIAGNOSIS — E039 Hypothyroidism, unspecified: Secondary | ICD-10-CM | POA: Diagnosis not present

## 2019-09-15 LAB — T4, FREE: Free T4: 1.3 ng/dL (ref 0.8–1.8)

## 2019-09-15 LAB — TSH: TSH: 1.3 mIU/L (ref 0.40–4.50)

## 2019-09-20 ENCOUNTER — Ambulatory Visit: Payer: Medicare Other | Admitting: Nurse Practitioner

## 2019-09-25 ENCOUNTER — Ambulatory Visit (INDEPENDENT_AMBULATORY_CARE_PROVIDER_SITE_OTHER): Payer: Medicare Other | Admitting: Nurse Practitioner

## 2019-09-25 ENCOUNTER — Encounter: Payer: Self-pay | Admitting: Nurse Practitioner

## 2019-09-25 ENCOUNTER — Other Ambulatory Visit: Payer: Self-pay

## 2019-09-25 VITALS — BP 129/93 | HR 99 | Ht 67.5 in | Wt 187.6 lb

## 2019-09-25 DIAGNOSIS — E049 Nontoxic goiter, unspecified: Secondary | ICD-10-CM | POA: Diagnosis not present

## 2019-09-25 DIAGNOSIS — E039 Hypothyroidism, unspecified: Secondary | ICD-10-CM | POA: Diagnosis not present

## 2019-09-25 NOTE — Progress Notes (Signed)
09/25/2019                                  Endocrinology Follow Up Visit   Subjective:    Patient ID: Heather Gill, female    DOB: Sep 02, 1955, PCP Asencion Noble, MD   Past Medical History:  Diagnosis Date   Anxiety    Blood in urine    Dr [place] Pink told her 6 yrs ago-no problems   Depression    Glaucoma    Hormone replacement therapy (HRT)    Hx of spinal fusion    Hypertension    Hypothyroid 01/22/2015   Hypothyroidism    Osteopenia    Raynauds syndrome    Thyroid nodule    Urinary incontinence 02/23/2013   Past Surgical History:  Procedure Laterality Date   BACK SURGERY     3 surg-last with fusion and rods   BACK SURGERY     COLONOSCOPY N/A 03/16/2012   Procedure: COLONOSCOPY;  Surgeon: Rogene Houston, MD;  Location: [facility];  Service: Endoscopy;  Laterality: N/A;  730   DIAGNOSTIC LAPAROSCOPY     dx   FOOT TENDON SURGERY     TUBAL LIGATION     Social History   Socioeconomic History   Marital status: Married    Spouse name: Gwyndolyn Saxon   Number of children: 2   Years of education: BA   Highest education level: Not on file  Occupational History    Employer: OTHER    Comment: disabled  Tobacco Use   Smoking status: Never Smoker   Smokeless tobacco: Never Used  Substance and Sexual Activity   Alcohol use: Yes    Alcohol/week: 4.0 standard drinks    Types: 4 Glasses of wine per week    Comment: wine 2-3 glasses 2-3 days out of the week   Drug use: No   Sexual activity: Yes    Birth control/protection: Post-menopausal, Surgical    Comment: tubal  Other Topics Concern   Not on file  Social History Narrative   Patient lives at home with spouse.   Caffeine Use: 3 Mt. Dew's 12oz. can daily   Social Determinants of Health   Financial Resource Strain:    Difficulty of Paying Living Expenses:   Food Insecurity:    Worried About Charity fundraiser in the Last Year:    Arboriculturist in the Last Year:   Transportation  Needs:    Film/video editor (Medical):    Lack of Transportation (Non-Medical):   Physical Activity:    Days of Exercise per Week:    Minutes of Exercise per Session:   Stress:    Feeling of Stress :   Social Connections:    Frequency of Communication with Friends and Family:    Frequency of Social Gatherings with Friends and Family:    Attends Religious Services:    Active Member of Clubs or Organizations:    Attends Archivist Meetings:    Marital Status:    Outpatient Encounter Medications as of 09/25/2019  Medication Sig   HYDROcodone-acetaminophen (NORCO/VICODIN) 5-325 MG per tablet Take 1 tablet by mouth every 6 (six) hours as needed. Pain, usually takes a benadryl wit it to control itching.   latanoprost (XALATAN) 0.005 % ophthalmic solution Place 1 drop into both eyes at bedtime.   levothyroxine (SYNTHROID) 75 MCG tablet As directed.   nystatin (MYCOSTATIN/NYSTOP) powder APPLY  TOPICALLY THREE TIMES DAILY   promethazine (PHENERGAN) 25 MG tablet Take 25 mg by mouth every 8 (eight) hours as needed for nausea or vomiting.   valACYclovir (VALTREX) 1000 MG tablet TAKE TWO TABLETS BY MOUTH TODAY AND TWO TABLETS IN THE MORNING FOR COLD SORES ASNEEDED   zolpidem (AMBIEN) 5 MG tablet Take 5 mg by mouth at bedtime as needed for sleep.   cyclobenzaprine (FLEXERIL) 10 MG tablet Take 10 mg by mouth 3 (three) times daily as needed.   pantoprazole (PROTONIX) 40 MG tablet Take 40 mg by mouth daily.   venlafaxine XR (EFFEXOR-XR) 75 MG 24 hr capsule Take 75 mg by mouth daily.   No facility-administered encounter medications on file as of 09/25/2019.   ALLERGIES: Allergies  Allergen Reactions   Demerol [Meperidine] Itching   Lortab [Hydrocodone-Acetaminophen] Itching   Morphine And Related Itching   Percocet [Oxycodone-Acetaminophen] Itching   VACCINATION STATUS:  There is no immunization history on file for this patient.  Thyroid Problem Presents  for follow-up visit. Patient reports no cold intolerance, constipation, fatigue, heat intolerance, palpitations, tremors, weight gain or weight loss. The symptoms have been stable.    64 year old female patient with medical history as above.  She is returning for follow-up of hypothyroidism.   -She remains on levothyroxine 75 mcg p.o. daily before breakfast.  She reports compliance to her medication.  She has no new complaints.    -She did have thyroid ultrasound in 2010 which showed right lobe of thyroid was 4.3 cm and left lobe was 3.6 cm with no discrete nodules.  -Her repeat bone density in August 2019 was reported as normal.  She gives a remote history of stress fracture.   Review of Systems  Constitutional: Negative for fatigue, weight gain and weight loss.  Cardiovascular: Negative for palpitations.  Gastrointestinal: Negative for constipation.  Endocrine: Negative for cold intolerance and heat intolerance.  Neurological: Negative for tremors.    Limited as above  Objective:    BP (!) 129/93 (BP Location: Left Arm, Patient Position: Sitting)    Pulse 99    Ht 5' 7.5" (1.715 m)    Wt 187 lb 9.6 oz (85.1 kg)    BMI 28.95 kg/m   Wt Readings from Last 3 Encounters:  09/25/19 187 lb 9.6 oz (85.1 kg)  03/24/18 191 lb (86.6 kg)  09/02/17 189 lb (85.7 kg)    BP Readings from Last 3 Encounters:  09/25/19 (!) 129/93  03/24/18 116/82  09/02/17 113/83    Physical Exam- Limited  Constitutional:  Body mass index is 28.95 kg/m. , not in acute distress, normal state of mind Eyes:  EOMI, no exophthalmos Neck: Supple Thyroid: No gross goiter Cardiovascular: RRR, no murmers, rubs, or gallops, no edema Respiratory: Adequate breathing efforts, no crackles, rales, rhonchi, or wheezing Musculoskeletal: no gross deformities, strength intact in all four extremities, no gross restriction of joint movements Skin:  no rashes, no hyperemia Neurological: no tremor with outstretched  hands   Recent Results (from the past 2160 hour(s))  TSH     Status: None   Collection Time: 09/14/19  2:42 PM  Result Value Ref Range   TSH 1.30 0.40 - 4.50 mIU/L  T4, free     Status: None   Collection Time: 09/14/19  2:42 PM  Result Value Ref Range   Free T4 1.3 0.8 - 1.8 ng/dL     THYROID ULTRASOUND 03/31/2019 TECHNIQUE: Ultrasound examination of the thyroid gland and adjacent soft tissues was performed.  COMPARISON:  09/15/2016  FINDINGS: Parenchymal Echotexture: Mildly heterogenous  Isthmus: 0.3 cm thickness, previously 0.2  Right lobe: 4.4 x 0.7 x 1.4 cm, previously 4.3 x 1.6 x 0.9  Left lobe: 4.3 x 0.7 x 0.8 cm, previously 4.6 x 1.4 x 0.9  _________________________________________________________  Estimated total number of nodules >/= 1 cm: 0  Number of spongiform nodules >/=  2 cm not described below (TR1): 0  Number of mixed cystic and solid nodules >/= 1.5 cm not described below ([place]): 0  _________________________________________________________  No discrete nodules are seen within the thyroid gland. 0.5 cm benign thyroid cyst, mid right.  IMPRESSION: Unremarkable thyroid. No nodule or other indication for biopsy or dedicated imaging follow-up.  Assessment & Plan:   1.  Hypothyroidism/ Nodular goiter - Her previsit thyroid function tests are consistent with appropriate replacement.  She is advised to continue levothyroxine 75 mcg po daily before breakfast.   Her most recent thyroid ultrasound on 03/31/2019 did not show any discrete nodules.  There is no need for further follow up imaging at this time.   - We discussed about the correct intake of her thyroid hormone, on empty stomach at fasting, with water, separated by at least 30 minutes from breakfast and other medications,  and separated by more than 4 hours from calcium, iron, multivitamins, acid reflux medications (PPIs). -Patient is made aware of the fact that thyroid hormone  replacement is needed for life, dose to be adjusted by periodic monitoring of thyroid function tests.   - I advised patient to maintain close follow up with Asencion Noble, MD for primary care needs.      - Time spent on this patient care encounter:  20 minutes of which 50% was spent in  counseling and the rest reviewing  her current and  previous labs / studies and medications  doses and developing a plan for long term care. Sherrie George  participated in the discussions, expressed understanding, and voiced agreement with the above plans.  All questions were answered to her satisfaction. she is encouraged to contact clinic should she have any questions or concerns prior to her return visit.    Follow up plan: Return in about 4 months (around 01/25/2020) for Thyroid follow up, Previsit labs.  Rayetta Pigg, FNP-BC [facility] Phone: [phone] Fax: [phone]   09/25/2019, 2:41 PM

## 2019-09-25 NOTE — Patient Instructions (Signed)
-   The correct intake of thyroid hormone (Levothyroxine, Synthroid), is on empty stomach first thing in the morning, with water, separated by at least 30 minutes from breakfast and other medications,  and separated by more than 4 hours from calcium, iron, multivitamins, acid reflux medications (PPIs). ° °- This medication is a life-long medication and will be needed to correct thyroid hormone imbalances for the rest of your life.  The dose may change from time to time, based on thyroid blood work. ° °- It is extremely important to be consistent taking this medication, near the same time each morning. °

## 2019-11-09 ENCOUNTER — Other Ambulatory Visit: Payer: Self-pay | Admitting: "Endocrinology

## 2019-11-14 DIAGNOSIS — M1711 Unilateral primary osteoarthritis, right knee: Secondary | ICD-10-CM | POA: Diagnosis not present

## 2019-11-22 ENCOUNTER — Other Ambulatory Visit (HOSPITAL_COMMUNITY): Payer: Self-pay | Admitting: Obstetrics & Gynecology

## 2019-11-22 DIAGNOSIS — Z1231 Encounter for screening mammogram for malignant neoplasm of breast: Secondary | ICD-10-CM

## 2019-12-08 ENCOUNTER — Ambulatory Visit (HOSPITAL_COMMUNITY): Payer: Medicare Other

## 2019-12-11 ENCOUNTER — Ambulatory Visit (HOSPITAL_COMMUNITY): Payer: Medicare Other

## 2019-12-20 DIAGNOSIS — M1711 Unilateral primary osteoarthritis, right knee: Secondary | ICD-10-CM | POA: Diagnosis not present

## 2019-12-20 DIAGNOSIS — M2170 Unequal limb length (acquired), unspecified site: Secondary | ICD-10-CM | POA: Diagnosis not present

## 2019-12-29 ENCOUNTER — Other Ambulatory Visit: Payer: Self-pay

## 2019-12-29 ENCOUNTER — Ambulatory Visit (HOSPITAL_COMMUNITY)
Admission: RE | Admit: 2019-12-29 | Discharge: 2019-12-29 | Disposition: A | Discharge status: Home / Self Care | Payer: Medicare Other | Source: Ambulatory Visit | Attending: Obstetrics & Gynecology | Admitting: Obstetrics & Gynecology

## 2019-12-29 DIAGNOSIS — Z1231 Encounter for screening mammogram for malignant neoplasm of breast: Secondary | ICD-10-CM | POA: Diagnosis not present

## 2020-01-13 ENCOUNTER — Other Ambulatory Visit: Payer: Self-pay | Admitting: Adult Health

## 2020-01-19 DIAGNOSIS — H40023 Open angle with borderline findings, high risk, bilateral: Secondary | ICD-10-CM | POA: Diagnosis not present

## 2020-01-25 ENCOUNTER — Ambulatory Visit: Payer: Medicare Other | Admitting: Nurse Practitioner

## 2020-02-15 ENCOUNTER — Ambulatory Visit: Payer: Medicare Other | Admitting: Nurse Practitioner

## 2020-02-22 DIAGNOSIS — E039 Hypothyroidism, unspecified: Secondary | ICD-10-CM | POA: Diagnosis not present

## 2020-02-23 LAB — T4, FREE: Free T4: 1.28 ng/dL (ref 0.82–1.77)

## 2020-02-23 LAB — TSH: TSH: 1.39 u[IU]/mL (ref 0.450–4.500)

## 2020-02-29 ENCOUNTER — Other Ambulatory Visit: Payer: Self-pay

## 2020-02-29 ENCOUNTER — Encounter: Payer: Self-pay | Admitting: Nurse Practitioner

## 2020-02-29 ENCOUNTER — Ambulatory Visit: Payer: Medicare Other | Admitting: Nurse Practitioner

## 2020-02-29 VITALS — BP 131/87 | HR 96 | Ht 67.5 in | Wt 192.0 lb

## 2020-02-29 DIAGNOSIS — E039 Hypothyroidism, unspecified: Secondary | ICD-10-CM | POA: Diagnosis not present

## 2020-02-29 NOTE — Progress Notes (Signed)
02/29/2020                                  Endocrinology Follow Up Visit   Subjective:    Patient ID: Heather Gill, female    DOB: 03/29/1955, PCP Asencion Noble, MD   Past Medical History:  Diagnosis Date  . Anxiety   . Blood in urine    Dr [place] Pink told her 6 yrs ago-no problems  . Depression   . Glaucoma   . Hormone replacement therapy (HRT)   . Hx of spinal fusion   . Hypertension   . Hypothyroid 01/22/2015  . Hypothyroidism   . Osteopenia   . Raynauds syndrome   . Thyroid nodule   . Urinary incontinence 02/23/2013   Past Surgical History:  Procedure Laterality Date  . BACK SURGERY     3 surg-last with fusion and rods  . BACK SURGERY    . COLONOSCOPY N/A 03/16/2012   Procedure: COLONOSCOPY;  Surgeon: Rogene Houston, MD;  [facility];  Service: Endoscopy;  Laterality: N/A;  730  . DIAGNOSTIC LAPAROSCOPY     dx  . FOOT TENDON SURGERY    . TUBAL LIGATION     Social History   Socioeconomic History  . Marital status: Married    Spouse name: Gwyndolyn Saxon  . Number of children: 2  . Years of education: BA  . Highest education level: Not on file  Occupational History    Employer: OTHER    Comment: disabled  Tobacco Use  . Smoking status: Never Smoker  . Smokeless tobacco: Never Used  Substance and Sexual Activity  . Alcohol use: Yes    Alcohol/week: 4.0 standard drinks    Types: 4 Glasses of wine per week    Comment: wine 2-3 glasses 2-3 days out of the week  . Drug use: No  . Sexual activity: Yes    Birth control/protection: Post-menopausal, Surgical    Comment: tubal  Other Topics Concern  . Not on file  Social History Narrative   Patient lives at home with spouse.   Caffeine Use: 3 Mt. Dew's 12oz. can daily   Social Determinants of Health   Financial Resource Strain: Not on file  Food Insecurity: Not on file  Transportation Needs: Not on file  Physical Activity: Not on file  Stress: Not on file  Social Connections: Not on file    Outpatient Encounter Medications as of 02/29/2020  Medication Sig  . cyclobenzaprine (FLEXERIL) 10 MG tablet Take 10 mg by mouth 3 (three) times daily as needed.  Marland Kitchen HYDROcodone-acetaminophen (NORCO/VICODIN) 5-325 MG per tablet Take 1 tablet by mouth every 8 (eight) hours as needed. Pain, usually takes a benadryl wit it to control itching.  . latanoprost (XALATAN) 0.005 % ophthalmic solution Place 1 drop into both eyes at bedtime.  Marland Kitchen levothyroxine (SYNTHROID) 75 MCG tablet TAKE ONE (1) TABLET BY MOUTH EVERY DAY  . LORazepam (ATIVAN) 0.5 MG tablet Take 0.5 mg by mouth 3 (three) times daily.  . nabumetone (RELAFEN) 500 MG tablet Take 500 mg by mouth 2 (two) times daily as needed.  . pantoprazole (PROTONIX) 40 MG tablet Take 40 mg by mouth daily.  . promethazine (PHENERGAN) 25 MG tablet Take 25 mg by mouth every 8 (eight) hours as needed for nausea or vomiting.  . valACYclovir (VALTREX) 1000 MG tablet TAKE TWO TABLETS BY MOUTH TODAY AND TWO TABLETS IN THE MORNING  FOR COLD SORES ASNEEDED  . venlafaxine XR (EFFEXOR-XR) 75 MG 24 hr capsule Take 75 mg by mouth daily.  Marland Kitchen zolpidem (AMBIEN) 5 MG tablet Take 5 mg by mouth at bedtime as needed for sleep.  Marland Kitchen nystatin (MYCOSTATIN/NYSTOP) powder APPLY TOPICALLY THREE TIMES DAILY   No facility-administered encounter medications on file as of 02/29/2020.   ALLERGIES: Allergies  Allergen Reactions  . Demerol [Meperidine] Itching  . Lortab [Hydrocodone-Acetaminophen] Itching  . Morphine And Related Itching  . Percocet [Oxycodone-Acetaminophen] Itching   VACCINATION STATUS:  There is no immunization history on file for this patient.  Thyroid Problem Presents for follow-up visit. Patient reports no anxiety, cold intolerance, constipation, depressed mood, fatigue, heat intolerance, palpitations, tremors, weight gain or weight loss. The symptoms have been stable.    65 year old female patient with medical history as above.  She is returning for follow-up  of hypothyroidism.    -She remains on levothyroxine 75 mcg p.o. daily before breakfast.  She reports compliance to her medication.  She has no new complaints.    -She did have thyroid ultrasound in 2010 which showed right lobe of thyroid was 4.3 cm and left lobe was 3.6 cm with no discrete nodules.  -Her repeat bone density in August 2019 was reported as normal.  She gives a remote history of stress fracture.   Review of systems  Constitutional: + Minimally fluctuating body weight,  current Body mass index is 29.63 kg/m. , no fatigue, no subjective hyperthermia, no subjective hypothermia Eyes: no blurry vision, no xerophthalmia ENT: no sore throat, no nodules palpated in throat, no dysphagia/odynophagia, no hoarseness Cardiovascular: no chest pain, no shortness of breath, no palpitations, no leg swelling Respiratory: no cough, no shortness of breath Gastrointestinal: no nausea/vomiting/diarrhea Musculoskeletal: no muscle/joint aches Skin: no rashes, no hyperemia Neurological: no tremors, no numbness, no tingling, no dizziness Psychiatric: no depression, no anxiety  Objective:    BP 131/87 (BP Location: Left Arm, Patient Position: Sitting)   Pulse 96   Ht 5' 7.5" (1.715 m)   Wt 192 lb (87.1 kg)   BMI 29.63 kg/m   Wt Readings from Last 3 Encounters:  02/29/20 192 lb (87.1 kg)  09/25/19 187 lb 9.6 oz (85.1 kg)  03/24/18 191 lb (86.6 kg)    BP Readings from Last 3 Encounters:  02/29/20 131/87  09/25/19 (!) 129/93  03/24/18 116/82    Physical Exam- Limited  Constitutional:  Body mass index is 29.63 kg/m. , not in acute distress, normal state of mind Eyes:  EOMI, no exophthalmos Neck: Supple Thyroid: No gross goiter Cardiovascular: RRR, no murmers, rubs, or gallops, no edema Respiratory: Adequate breathing efforts, no crackles, rales, rhonchi, or wheezing Musculoskeletal: no gross deformities, strength intact in all four extremities, no gross restriction of joint  movements Skin:  no rashes, no hyperemia Neurological: no tremor with outstretched hands   Recent Results (from the past 2160 hour(s))  TSH     Status: None   Collection Time: 02/22/20 11:27 AM  Result Value Ref Range   TSH 1.390 0.450 - 4.500 uIU/mL  T4, free     Status: None   Collection Time: 02/22/20 11:27 AM  Result Value Ref Range   Free T4 1.28 0.82 - 1.77 ng/dL    Thyroid US 03/31/19  CLINICAL DATA:  Goiter.  On Synthroid  EXAM: THYROID ULTRASOUND  TECHNIQUE: Ultrasound examination of the thyroid gland and adjacent soft tissues was performed.  COMPARISON:  09/15/2016  FINDINGS: Parenchymal Echotexture: Mildly heterogenous  Isthmus: 0.3 cm thickness, previously 0.2  Right lobe: 4.4 x 0.7 x 1.4 cm, previously 4.3 x 1.6 x 0.9  Left lobe: 4.3 x 0.7 x 0.8 cm, previously 4.6 x 1.4 x 0.9  _________________________________________________________  Estimated total number of nodules >/= 1 cm: 0  Number of spongiform nodules >/=  2 cm not described below (TR1): 0  Number of mixed cystic and solid nodules >/= 1.5 cm not described below ([place]): 0  _________________________________________________________  No discrete nodules are seen within the thyroid gland. 0.5 cm benign thyroid cyst, mid right.  IMPRESSION: Unremarkable thyroid. No nodule or other indication for biopsy or dedicated imaging follow-up.  The above is in keeping with the ACR TI-RADS recommendations - J Am Coll Radiol 2017;14:587-595.   Electronically Signed   By: Lucrezia Europe M.D.   On: 03/31/2019 16:56  Assessment & Plan:   1.  Hypothyroidism, unspecified -Her previsit thyroid function tests are consistent with appropriate hormone replacement.  She is advised to continue Levothyroxine 75 mcg po daily before breakfast. She says she has not always taken her medication properly (will take it with her other medications at times).   - We discussed about the correct intake of  her thyroid hormone, on empty stomach at fasting, with water, separated by at least 30 minutes from breakfast and other medications,  and separated by more than 4 hours from calcium, iron, multivitamins, acid reflux medications (PPIs). -Patient is made aware of the fact that thyroid hormone replacement is needed for life, dose to be adjusted by periodic monitoring of thyroid function tests.  Her most recent thyroid ultrasound on 03/31/2019 did not show any discrete nodules.  There is no need for further follow up imaging at this time.  - I advised patient to maintain close follow up with Asencion Noble, MD for primary care needs.       - Time spent on this patient care encounter:  20 minutes of which 50% was spent in  counseling and the rest reviewing  her current and  previous labs / studies and medications  doses and developing a plan for long term care. Sherrie George  participated in the discussions, expressed understanding, and voiced agreement with the above plans.  All questions were answered to her satisfaction. she is encouraged to contact clinic should she have any questions or concerns prior to her return visit.   Follow up plan: Return in about 1 year (around 02/28/2021) for Thyroid follow up, Previsit labs.  Rayetta Pigg, [facility] [address] [place], [place] [address] Phone: [phone] Fax: [phone]   02/29/2020, 11:12 AM

## 2020-02-29 NOTE — Patient Instructions (Signed)

## 2020-03-15 DIAGNOSIS — E039 Hypothyroidism, unspecified: Secondary | ICD-10-CM | POA: Diagnosis not present

## 2020-03-16 LAB — T4, FREE: Free T4: 1.28 ng/dL (ref 0.82–1.77)

## 2020-03-16 LAB — TSH: TSH: 1.29 u[IU]/mL (ref 0.450–4.500)

## 2020-03-19 DIAGNOSIS — E785 Hyperlipidemia, unspecified: Secondary | ICD-10-CM | POA: Diagnosis not present

## 2020-03-19 DIAGNOSIS — M5416 Radiculopathy, lumbar region: Secondary | ICD-10-CM | POA: Diagnosis not present

## 2020-03-20 DIAGNOSIS — E785 Hyperlipidemia, unspecified: Secondary | ICD-10-CM | POA: Diagnosis not present

## 2020-03-20 DIAGNOSIS — Z79899 Other long term (current) drug therapy: Secondary | ICD-10-CM | POA: Diagnosis not present

## 2020-03-20 DIAGNOSIS — E039 Hypothyroidism, unspecified: Secondary | ICD-10-CM | POA: Diagnosis not present

## 2020-03-20 DIAGNOSIS — G4700 Insomnia, unspecified: Secondary | ICD-10-CM | POA: Diagnosis not present

## 2020-04-30 ENCOUNTER — Other Ambulatory Visit: Payer: Self-pay

## 2020-04-30 ENCOUNTER — Other Ambulatory Visit (HOSPITAL_COMMUNITY)
Admission: RE | Admit: 2020-04-30 | Discharge: 2020-04-30 | Disposition: A | Discharge status: Home / Self Care | Payer: Medicare Other | Source: Ambulatory Visit | Attending: Adult Health | Admitting: Adult Health

## 2020-04-30 ENCOUNTER — Encounter: Payer: Self-pay | Admitting: Adult Health

## 2020-04-30 ENCOUNTER — Ambulatory Visit (INDEPENDENT_AMBULATORY_CARE_PROVIDER_SITE_OTHER): Payer: Medicare Other | Admitting: Adult Health

## 2020-04-30 VITALS — BP 118/83 | HR 100 | Ht 67.5 in | Wt 188.4 lb

## 2020-04-30 DIAGNOSIS — Z01419 Encounter for gynecological examination (general) (routine) without abnormal findings: Secondary | ICD-10-CM | POA: Diagnosis present

## 2020-04-30 DIAGNOSIS — Z1211 Encounter for screening for malignant neoplasm of colon: Secondary | ICD-10-CM

## 2020-04-30 LAB — HEMOCCULT GUIAC POC 1CARD (OFFICE): Fecal Occult Blood, POC: NEGATIVE

## 2020-04-30 NOTE — Progress Notes (Signed)
Patient ID: Heather Gill, female   DOB: 26-Feb-1955, 65 y.o.   MRN: [id number] History of Present Illness:  Heather Gill is a 65 year old white female, married, PM in for a well woman gyn exam and pap. Last pap was 03/22/17 negative HPV atypical squamous cells with atropic pattern with epithelial atypia. PCP is Dr Willey Blade.  Current Medications, Allergies, Past Medical History, Past Surgical History, Family History and Social History were reviewed in [employer] record.     Review of Systems: Patient denies any headaches, hearing loss, fatigue, blurred vision, shortness of breath, chest pain, abdominal pain, problems with bowel movements, urination, or intercourse. No mood swings. Has back pain and hips hurt.   Physical Exam:BP 118/83 (BP Location: Right Arm, Patient Position: Sitting, Cuff Size: Normal)   Pulse 100   Ht 5' 7.5" (1.715 m)   Wt 188 lb 6.4 oz (85.5 kg)   BMI 29.07 kg/m  General:  Well developed, well nourished, no acute distress Skin:  Warm and dry Neck:  Midline trachea, normal thyroid, good ROM, no lymphadenopathy, no carotid bruits heard Lungs; Clear to auscultation bilaterally Breast:  No dominant palpable mass, retraction, or nipple discharge Cardiovascular: Regular rate and rhythm Abdomen:  Soft, non tender, no hepatosplenomegaly Pelvic:  External genitalia is normal in appearance, no lesions.  The vagina is pale with loss of moisture and rugae. Urethra has no lesions or masses. The cervix is smooth, pap with HR HPV genotyping performed.   Uterus is felt to be normal size, shape, and contour.  No adnexal masses or tenderness noted.Bladder is non tender, no masses felt. Rectal: Good sphincter tone, no polyps, or hemorrhoids felt.  Hemoccult negative. Extremities/musculoskeletal:  No swelling,+ varicosities noted, no clubbing or cyanosis Psych:  No mood changes, alert and cooperative,seems happy AA is 4 Fall risk is low PHQ 9 score is 6  GAD 7 score is  0  Upstream - 04/30/20 1448      Pregnancy Intention Screening   Does the patient want to become pregnant in the next year? No    Does the patient's partner want to become pregnant in the next year? No    Would the patient like to discuss contraceptive options today? No      Contraception Wrap Up   Current Method Female Sterilization   post-menopausal   End Method Female Sterilization    Contraception Counseling Provided No          Co exam with Tinnie Gens NP student   Impression and Plan: 1. Encounter for gynecological examination with Papanicolaou smear of cervix Pap sent Physical in 1 year  Pap in 3 if normal Mammogram yearly Labs with PCP and Dr Dorris Fetch Colonoscopy per GI  2. Encounter for screening fecal occult blood testing

## 2020-05-06 LAB — CYTOLOGY - PAP
Comment: NEGATIVE
Diagnosis: NEGATIVE
Diagnosis: REACTIVE
High risk HPV: NEGATIVE

## 2020-05-21 DIAGNOSIS — H401191 Primary open-angle glaucoma, unspecified eye, mild stage: Secondary | ICD-10-CM | POA: Diagnosis not present

## 2020-05-23 ENCOUNTER — Other Ambulatory Visit: Payer: Self-pay | Admitting: Nurse Practitioner

## 2020-06-21 DIAGNOSIS — Z79899 Other long term (current) drug therapy: Secondary | ICD-10-CM | POA: Diagnosis not present

## 2020-06-21 DIAGNOSIS — E039 Hypothyroidism, unspecified: Secondary | ICD-10-CM | POA: Diagnosis not present

## 2020-06-21 DIAGNOSIS — E785 Hyperlipidemia, unspecified: Secondary | ICD-10-CM | POA: Diagnosis not present

## 2020-06-28 DIAGNOSIS — E785 Hyperlipidemia, unspecified: Secondary | ICD-10-CM | POA: Diagnosis not present

## 2020-06-28 DIAGNOSIS — M47816 Spondylosis without myelopathy or radiculopathy, lumbar region: Secondary | ICD-10-CM | POA: Diagnosis not present

## 2020-07-01 ENCOUNTER — Other Ambulatory Visit (HOSPITAL_COMMUNITY): Payer: Self-pay | Admitting: Internal Medicine

## 2020-07-01 DIAGNOSIS — M47816 Spondylosis without myelopathy or radiculopathy, lumbar region: Secondary | ICD-10-CM

## 2020-07-12 ENCOUNTER — Ambulatory Visit (HOSPITAL_COMMUNITY)
Admission: RE | Admit: 2020-07-12 | Discharge: 2020-07-12 | Disposition: A | Discharge status: Home / Self Care | Payer: Medicare Other | Source: Ambulatory Visit | Attending: Internal Medicine | Admitting: Internal Medicine

## 2020-07-12 ENCOUNTER — Other Ambulatory Visit: Payer: Self-pay

## 2020-07-12 DIAGNOSIS — M47816 Spondylosis without myelopathy or radiculopathy, lumbar region: Secondary | ICD-10-CM | POA: Diagnosis not present

## 2020-07-12 DIAGNOSIS — M545 Low back pain, unspecified: Secondary | ICD-10-CM | POA: Diagnosis not present

## 2020-07-12 MED ORDER — GADOBUTROL 1 MMOL/ML IV SOLN
8.0000 mL | Freq: Once | INTRAVENOUS | Status: AC | PRN
  Administered 2020-07-12: 8 mL via INTRAVENOUS

## 2020-11-26 ENCOUNTER — Other Ambulatory Visit: Payer: Self-pay | Admitting: Nurse Practitioner

## 2020-12-18 DIAGNOSIS — M5442 Lumbago with sciatica, left side: Secondary | ICD-10-CM | POA: Diagnosis not present

## 2020-12-18 DIAGNOSIS — G8929 Other chronic pain: Secondary | ICD-10-CM | POA: Diagnosis not present

## 2020-12-18 DIAGNOSIS — R030 Elevated blood-pressure reading, without diagnosis of hypertension: Secondary | ICD-10-CM | POA: Diagnosis not present

## 2021-01-09 ENCOUNTER — Other Ambulatory Visit (HOSPITAL_COMMUNITY): Payer: Self-pay | Admitting: Internal Medicine

## 2021-01-09 DIAGNOSIS — Z1231 Encounter for screening mammogram for malignant neoplasm of breast: Secondary | ICD-10-CM

## 2021-01-20 ENCOUNTER — Ambulatory Visit (HOSPITAL_COMMUNITY): Payer: Medicare Other

## 2021-01-28 DIAGNOSIS — H401131 Primary open-angle glaucoma, bilateral, mild stage: Secondary | ICD-10-CM | POA: Diagnosis not present

## 2021-02-19 ENCOUNTER — Other Ambulatory Visit: Payer: Self-pay

## 2021-02-19 ENCOUNTER — Telehealth: Payer: Self-pay | Admitting: Nurse Practitioner

## 2021-02-19 DIAGNOSIS — E039 Hypothyroidism, unspecified: Secondary | ICD-10-CM

## 2021-02-19 NOTE — Telephone Encounter (Signed)
No ma'am, those will do for now

## 2021-02-19 NOTE — Telephone Encounter (Signed)
Just wanted to check to see if this patient needed any other labs besides Free T4 and TSH?

## 2021-02-19 NOTE — Telephone Encounter (Signed)
Patient needs her labs updated at [facility]

## 2021-02-19 NOTE — Telephone Encounter (Signed)
Labs have been ordered

## 2021-02-21 DIAGNOSIS — E039 Hypothyroidism, unspecified: Secondary | ICD-10-CM | POA: Diagnosis not present

## 2021-02-26 LAB — T4, FREE: Free T4: 1.15 ng/dL (ref 0.82–1.77)

## 2021-02-26 LAB — TSH: TSH: 2.08 u[IU]/mL (ref 0.450–4.500)

## 2021-02-27 NOTE — Patient Instructions (Signed)

## 2021-02-28 ENCOUNTER — Encounter: Payer: Self-pay | Admitting: Nurse Practitioner

## 2021-02-28 ENCOUNTER — Other Ambulatory Visit: Payer: Self-pay

## 2021-02-28 ENCOUNTER — Ambulatory Visit: Payer: Medicare Other | Admitting: Nurse Practitioner

## 2021-02-28 VITALS — BP 133/89 | HR 96 | Ht 67.5 in | Wt 191.0 lb

## 2021-02-28 DIAGNOSIS — E049 Nontoxic goiter, unspecified: Secondary | ICD-10-CM | POA: Diagnosis not present

## 2021-02-28 DIAGNOSIS — E039 Hypothyroidism, unspecified: Secondary | ICD-10-CM | POA: Diagnosis not present

## 2021-02-28 MED ORDER — LEVOTHYROXINE SODIUM 75 MCG PO TABS: 75.0000 ug | ORAL_TABLET | Freq: Every day | ORAL | 3 refills | Status: DC

## 2021-02-28 NOTE — Progress Notes (Signed)
02/28/2021                                  Endocrinology Follow Up Visit   Subjective:    Patient ID: Heather Gill, female    DOB: [birth year], PCP Asencion Noble, MD   Past Medical History:  Diagnosis Date   Anxiety    Blood in urine    Dr Maryland Pink told her 6 yrs ago-no problems   Depression    Glaucoma    Hormone replacement therapy (HRT)    Hx of spinal fusion    Hypertension    Hypothyroid 01/22/2015   Hypothyroidism    Osteopenia    Raynauds syndrome    Thyroid nodule    Urinary incontinence 02/23/2013   Past Surgical History:  Procedure Laterality Date   BACK SURGERY     3 surg-last with fusion and rods   BACK SURGERY     COLONOSCOPY N/A 03/16/2012   Procedure: COLONOSCOPY;  Surgeon: Rogene Houston, MD;  [facility]: [facility];  Service: Endoscopy;  Laterality: N/A;  730   DIAGNOSTIC LAPAROSCOPY     dx   FOOT TENDON SURGERY     TUBAL LIGATION     Social History   Socioeconomic History   Marital status: Married    Spouse name: Gwyndolyn Saxon   Number of children: 2   Years of education: BA   Highest education level: Not on file  Occupational History    Employer: OTHER    Comment: disabled  Tobacco Use   Smoking status: Never   Smokeless tobacco: Never  Vaping Use   Vaping Use: Never used  Substance and Sexual Activity   Alcohol use: Yes    Alcohol/week: 4.0 standard drinks    Types: 4 Glasses of wine per week    Comment: wine 2-3 glasses 2-3 days out of the week   Drug use: No   Sexual activity: Yes    Birth control/protection: Post-menopausal, Surgical    Comment: tubal  Other Topics Concern   Not on file  Social History Narrative   Patient lives at home with spouse.   Caffeine Use: 3 Mt. Dew's 12oz. can daily   Social Determinants of Health   Financial Resource Strain: Low Risk    Difficulty of Paying Living Expenses: Not very hard  Food Insecurity: No Food Insecurity   Worried About Charity fundraiser in the Last Year: Never true   Ran Out  of Food in the Last Year: Never true  Transportation Needs: No Transportation Needs   Lack of Transportation (Medical): No   Lack of Transportation (Non-Medical): No  Physical Activity: Inactive   Days of Exercise per Week: 0 days   Minutes of Exercise per Session: 0 min  Stress: Stress Concern Present   Feeling of Stress : To some extent  Social Connections: Engineer, building services of Communication with Friends and Family: More than three times a week   Frequency of Social Gatherings with Friends and Family: Once a week   Attends Religious Services: 1 to 4 times per year   Active Member of [employer] or Organizations: Yes   Attends Archivist Meetings: Never   Marital Status: Married   Outpatient Encounter Medications as of 02/28/2021  Medication Sig   cyclobenzaprine (FLEXERIL) 10 MG tablet Take 10 mg by mouth 3 (three) times daily as needed.   HYDROcodone-acetaminophen (NORCO/VICODIN) 5-325 MG  per tablet Take 1 tablet by mouth every 8 (eight) hours as needed. Pain, usually takes a benadryl wit it to control itching.   latanoprost (XALATAN) 0.005 % ophthalmic solution Place 1 drop into both eyes at bedtime.   levothyroxine (SYNTHROID) 75 MCG tablet Take 1 tablet (75 mcg total) by mouth daily before breakfast.   pantoprazole (PROTONIX) 40 MG tablet Take 40 mg by mouth daily.   promethazine (PHENERGAN) 25 MG tablet Take 25 mg by mouth every 8 (eight) hours as needed for nausea or vomiting.   traZODone (DESYREL) 100 MG tablet Take 100 mg by mouth at bedtime.   valACYclovir (VALTREX) 1000 MG tablet TAKE TWO TABLETS BY MOUTH TODAY AND TWO TABLETS IN THE MORNING FOR COLD SORES ASNEEDED   venlafaxine XR (EFFEXOR-XR) 75 MG 24 hr capsule Take 75 mg by mouth daily.   zolpidem (AMBIEN) 10 MG tablet Take 10 mg by mouth at bedtime.   [DISCONTINUED] levothyroxine (SYNTHROID) 75 MCG tablet TAKE ONE (1) TABLET BY MOUTH EVERY DAY   No facility-administered encounter medications on file as  of 02/28/2021.   ALLERGIES: Allergies  Allergen Reactions   Demerol [Meperidine] Itching   Lortab [Hydrocodone-Acetaminophen] Itching   Morphine And Related Itching   Percocet [Oxycodone-Acetaminophen] Itching   VACCINATION STATUS:  There is no immunization history on file for this patient.  Thyroid Problem Presents for follow-up visit. Patient reports no anxiety, cold intolerance, constipation, depressed mood, diarrhea, fatigue, heat intolerance, leg swelling, palpitations, tremors, weight gain or weight loss. The symptoms have been stable.   66 year old female patient with medical history as above.  She is returning for follow-up of hypothyroidism.    -She remains on levothyroxine 75 mcg p.o. daily before breakfast.  She reports compliance to her medication.  She has no new complaints.    -She did have thyroid ultrasound in 2010 which showed right lobe of thyroid was 4.3 cm and left lobe was 3.6 cm with no discrete nodules.  -Her repeat bone density in August 2019 was reported as normal.  She gives a remote history of stress fracture.   Review of systems  Constitutional: + Minimally fluctuating body weight,  current Body mass index is 29.47 kg/m. , no fatigue, no subjective hyperthermia, no subjective hypothermia, says she has been more thirsty lately- sees her PCP for physical next month Eyes: no blurry vision, no xerophthalmia ENT: no sore throat, no nodules palpated in throat, no dysphagia/odynophagia, no hoarseness Cardiovascular: no chest pain, no shortness of breath, no palpitations, no leg swelling Respiratory: no cough, no shortness of breath Gastrointestinal: no nausea/vomiting/diarrhea Musculoskeletal: no muscle/joint aches Skin: no rashes, no hyperemia Neurological: no tremors, no numbness, no tingling, no dizziness Psychiatric: no depression, no anxiety  Objective:    BP 133/89    Pulse 96    Ht 5' 7.5" (1.715 m)    Wt 191 lb (86.6 kg)    BMI 29.47 kg/m   Wt  Readings from Last 3 Encounters:  02/28/21 191 lb (86.6 kg)  04/30/20 188 lb 6.4 oz (85.5 kg)  02/29/20 192 lb (87.1 kg)    BP Readings from Last 3 Encounters:  02/28/21 133/89  04/30/20 118/83  02/29/20 131/87     Physical Exam- Limited  Constitutional:  Body mass index is 29.47 kg/m. , not in acute distress, normal state of mind Eyes:  EOMI, no exophthalmos Neck: Supple Cardiovascular: RRR, no murmurs, rubs, or gallops, no edema Respiratory: Adequate breathing efforts, no crackles, rales, rhonchi, or wheezing Musculoskeletal: no  gross deformities, strength intact in all four extremities, no gross restriction of joint movements Skin:  no rashes, no hyperemia Neurological: no tremor with outstretched hands   Recent Results (from the past 2160 hour(s))  T4, Free     Status: None   Collection Time: 02/21/21 11:01 AM  Result Value Ref Range   Free T4 1.15 0.82 - 1.77 ng/dL  TSH     Status: None   Collection Time: 02/21/21 11:01 AM  Result Value Ref Range   TSH 2.080 0.450 - 4.500 uIU/mL     Thyroid US 03/31/19   CLINICAL DATA:  Goiter.  On Synthroid   EXAM: THYROID ULTRASOUND   TECHNIQUE: Ultrasound examination of the thyroid gland and adjacent soft tissues was performed.   COMPARISON:  09/15/2016   FINDINGS: Parenchymal Echotexture: Mildly heterogenous   Isthmus: 0.3 cm thickness, previously 0.2   Right lobe: 4.4 x 0.7 x 1.4 cm, previously 4.3 x 1.6 x 0.9   Left lobe: 4.3 x 0.7 x 0.8 cm, previously 4.6 x 1.4 x 0.9   _________________________________________________________   Estimated total number of nodules >/= 1 cm: 0   Number of spongiform nodules >/=  2 cm not described below (TR1): 0   Number of mixed cystic and solid nodules >/= 1.5 cm not described below ([place]): 0   _________________________________________________________   No discrete nodules are seen within the thyroid gland. 0.5 cm benign thyroid cyst, mid right.    IMPRESSION: Unremarkable thyroid. No nodule or other indication for biopsy or dedicated imaging follow-up.   The above is in keeping with the ACR TI-RADS recommendations - J Am Coll Radiol 2017;14:587-595.     Electronically Signed   By: Lucrezia Europe M.D.   On: 03/31/2019 16:56    Latest Reference Range & Units 04/07/19 11:34 09/14/19 14:42 02/22/20 11:27 03/15/20 10:49 02/21/21 11:01  TSH 0.450 - 4.500 uIU/mL 3.40 1.30 1.390 1.290 2.080  T4,Free(Direct) 0.82 - 1.77 ng/dL 1.0 1.3 1.28 1.28 1.15   Assessment & Plan:   1.  Hypothyroidism, unspecified -Her previsit thyroid function tests are consistent with appropriate hormone replacement.  She is advised to continue Levothyroxine 75 mcg po daily before breakfast.    - We discussed about the correct intake of her thyroid hormone, on empty stomach at fasting, with water, separated by at least 30 minutes from breakfast and other medications,  and separated by more than 4 hours from calcium, iron, multivitamins, acid reflux medications (PPIs). -Patient is made aware of the fact that thyroid hormone replacement is needed for life, dose to be adjusted by periodic monitoring of thyroid function tests.  Her most recent thyroid ultrasound on 03/31/2019 did not show any discrete nodules.  There is no need for further follow up imaging at this time.  - I advised patient to maintain close follow up with Asencion Noble, MD for primary care needs.      I spent 20 minutes in the care of the patient today including review of labs from Thyroid Function, CMP, and other relevant labs ; imaging/biopsy records (current and previous including abstractions from other facilities); face-to-face time discussing  her lab results and symptoms, medications doses, her options of short and long term treatment based on the latest standards of care / guidelines;   and documenting the encounter.  Sherrie George  participated in the discussions, expressed understanding, and  voiced agreement with the above plans.  All questions were answered to her satisfaction. she is encouraged to contact  clinic should she have any questions or concerns prior to her return visit.   Follow up plan: Return in about 1 year (around 02/28/2022) for Thyroid follow up, Previsit labs.  Rayetta Pigg, [facility] [address] [place], [place] [address] Phone: [phone] Fax: [phone]   02/28/2021, 11:09 AM

## 2021-05-01 ENCOUNTER — Encounter: Payer: Self-pay | Admitting: Adult Health

## 2021-05-01 ENCOUNTER — Ambulatory Visit (INDEPENDENT_AMBULATORY_CARE_PROVIDER_SITE_OTHER): Payer: Medicare Other | Admitting: Adult Health

## 2021-05-01 ENCOUNTER — Other Ambulatory Visit: Payer: Self-pay

## 2021-05-01 VITALS — BP 121/86 | HR 104 | Ht 67.5 in | Wt 187.0 lb

## 2021-05-01 DIAGNOSIS — Z1211 Encounter for screening for malignant neoplasm of colon: Secondary | ICD-10-CM

## 2021-05-01 DIAGNOSIS — Z01419 Encounter for gynecological examination (general) (routine) without abnormal findings: Secondary | ICD-10-CM

## 2021-05-01 LAB — HEMOCCULT GUIAC POC 1CARD (OFFICE): Fecal Occult Blood, POC: NEGATIVE

## 2021-05-01 IMAGING — US US THYROID
1 series · 14 of 25 positions shown · non-contrast
Comparison: 09/15/2016

CLINICAL DATA: Goiter.  On Synthroid

EXAM:
THYROID ULTRASOUND
TECHNIQUE: Ultrasound examination of the thyroid gland and adjacent soft
tissues was performed.

[Series 1: us thyroid · 14 of 47 slices shown]
[im 1/47]
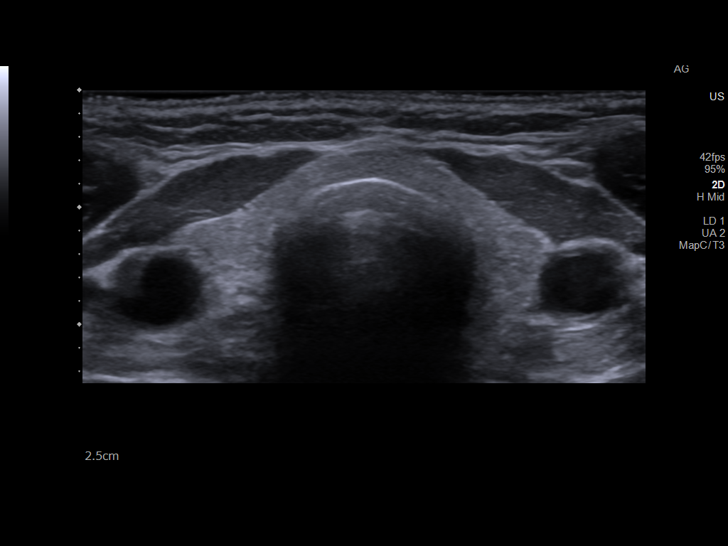
[im 4/47]
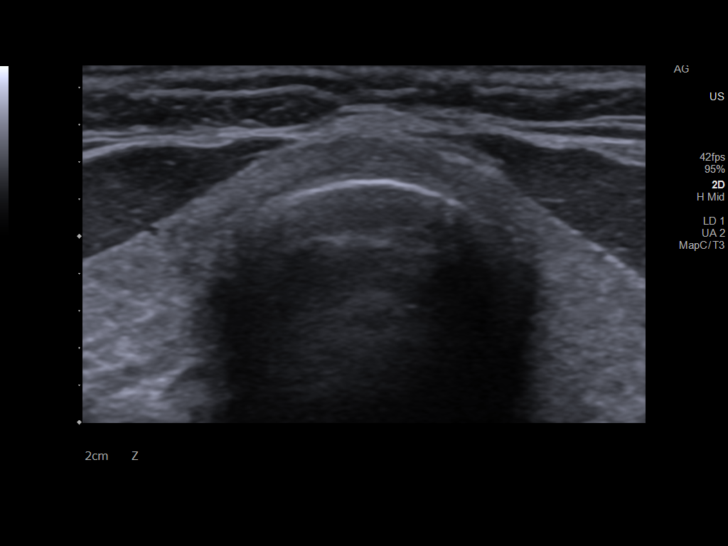
[im 8/47]
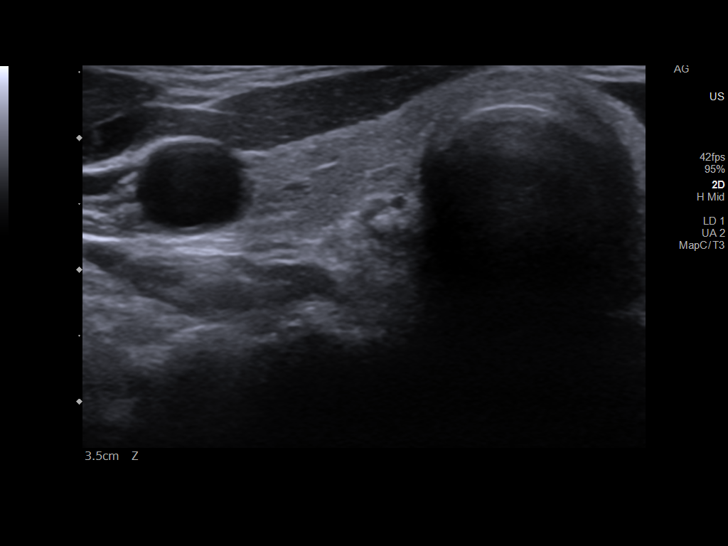
[im 12/47]
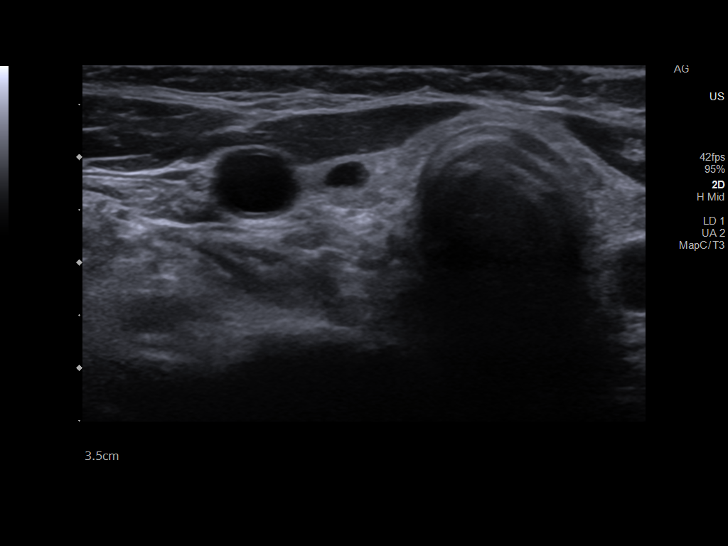
[im 16/47]
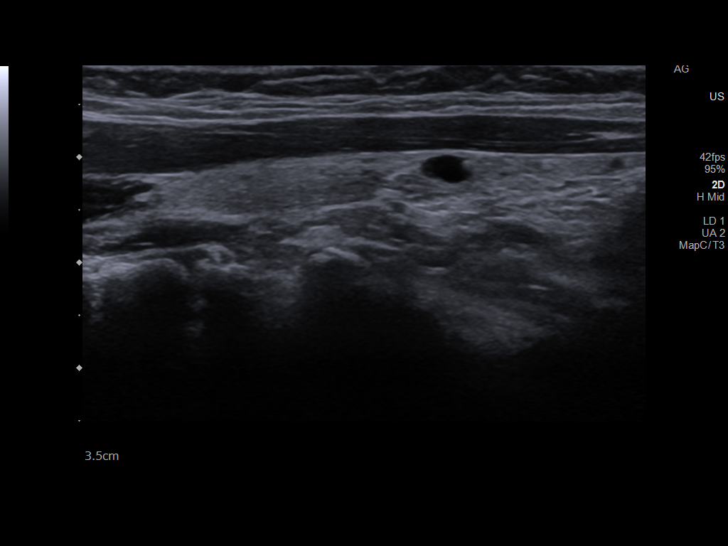
[im 18/47]
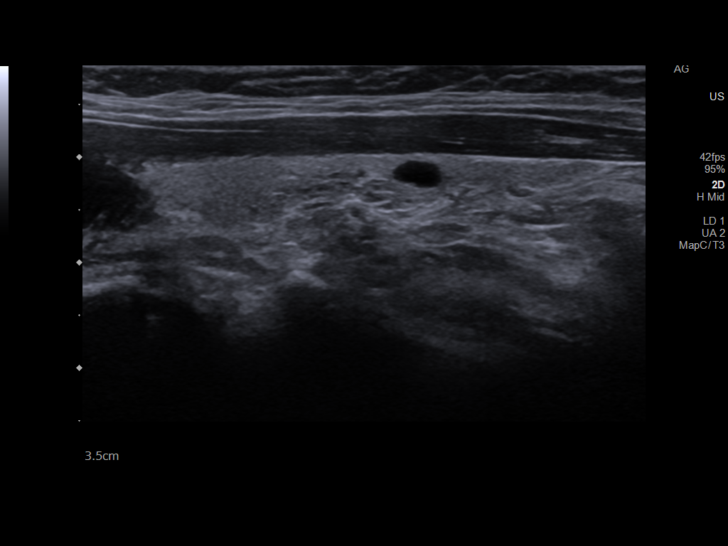
[im 22/47]
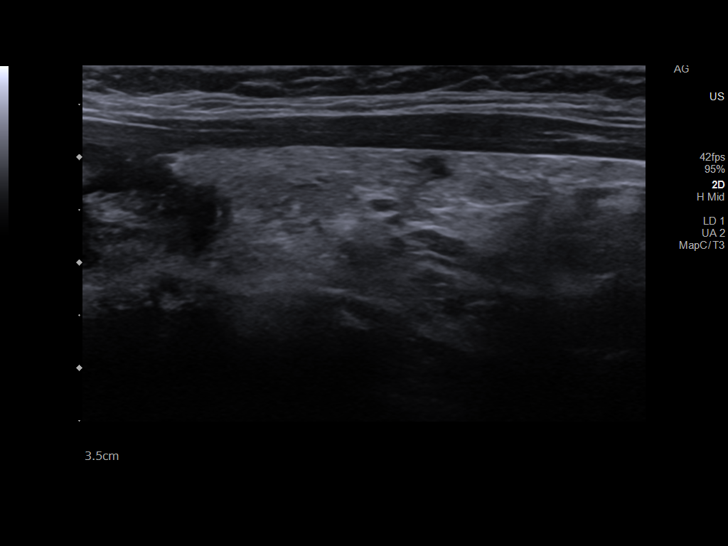
[im 25/47]
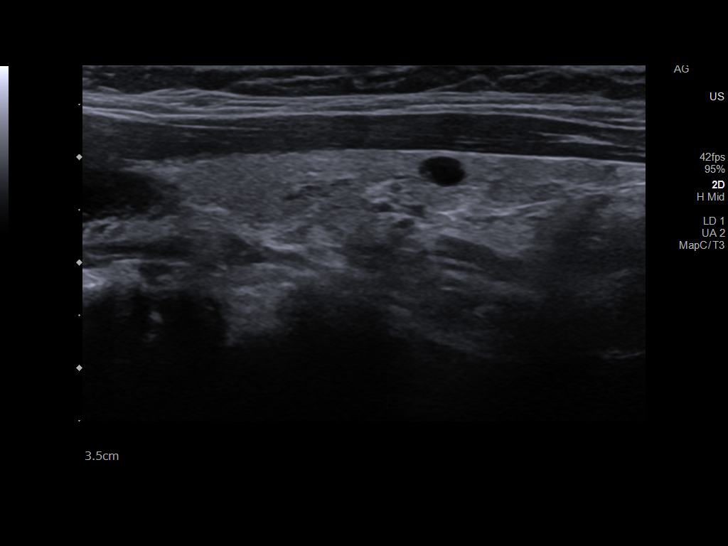
[im 29/47]
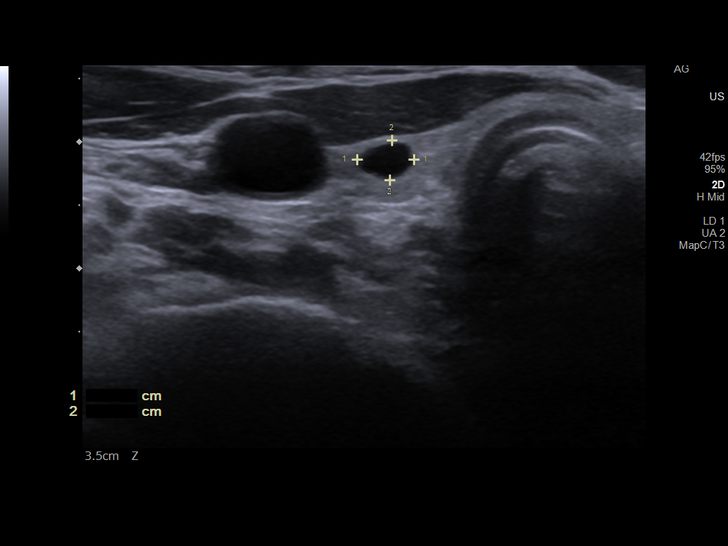
[im 31/47]
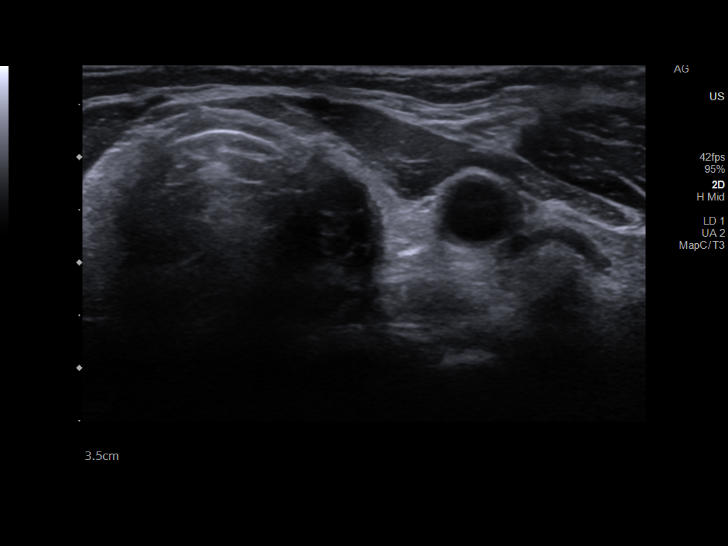
[im 35/47]
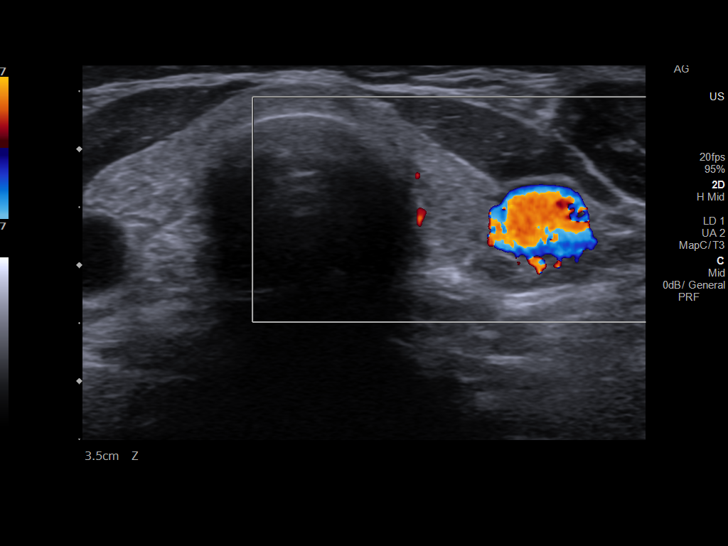
[im 39/47]
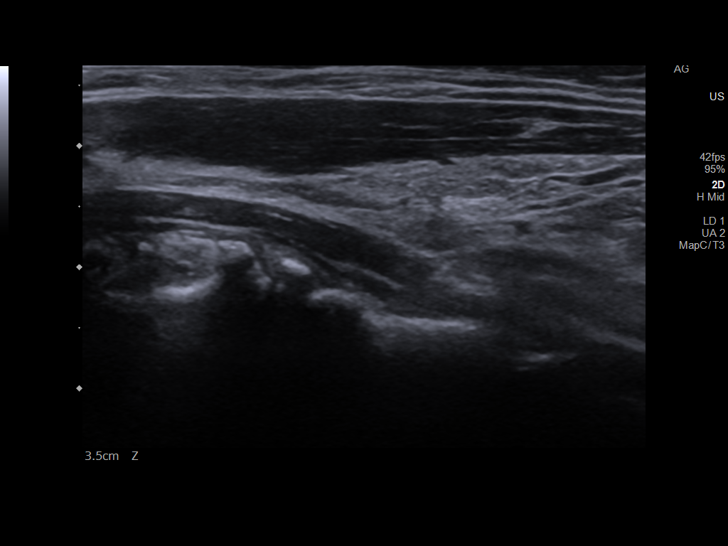
[im 43/47]
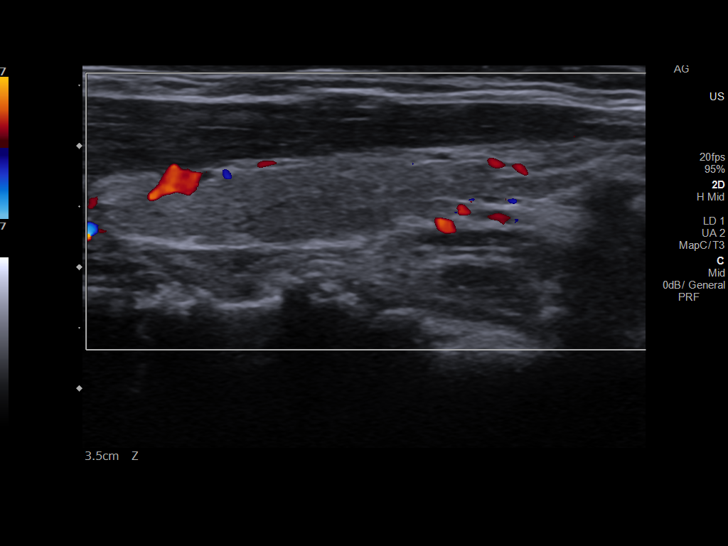
[im 47/47]
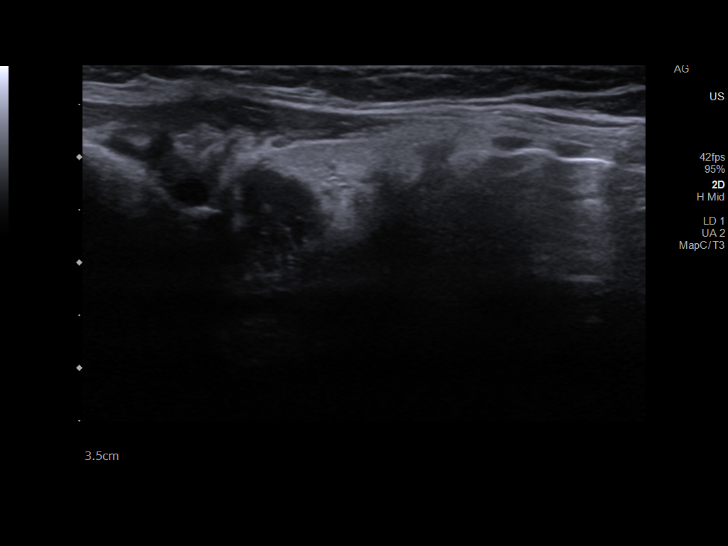

[14 of 25 positions shown; findings below may reference images not displayed]

FINDINGS: Parenchymal Echotexture: Mildly heterogenous

Isthmus: 0.3 cm thickness, previously

Right lobe: 4.4 x 0.7 x 1.4 cm, previously 4.3 x 1.6 x

Left lobe: 4.3 x 0.7 x 0.8 cm, previously 4.6 x 1.4 x

_________________________________________________________

Estimated total number of nodules >/= 1 cm: 0

Number of spongiform nodules >/=  2 cm not described below (TR1): 0

Number of mixed cystic and solid nodules >/= 1.5 cm not described
below (TR2): 0

_________________________________________________________

No discrete nodules are seen within the thyroid gland. 0.5 cm benign
thyroid cyst, mid right.
IMPRESSION: Unremarkable thyroid. No nodule or other indication for biopsy or
dedicated imaging follow-up.

The above is in keeping with the ACR TI-RADS recommendations - [HOSPITAL] 3202;[DATE].

## 2021-05-01 NOTE — Progress Notes (Signed)
Patient ID: Heather Gill, female   DOB: 28-Nov-1955, 66 y.o.   MRN: [id number] ?History of Present Illness: ?Heather Gill is a 66 year old white female, married, G2P2 in for a well woman gyn exam. ?Lab Results  ?Component Value Date  ? DIAGPAP  04/30/2020  ?  - Negative for Intraepithelial Lesions or Malignancy (NILM)  ? DIAGPAP - Benign reactive/reparative changes 04/30/2020  ? HPV NOT DETECTED 03/22/2017  ? Heather Gill Negative 04/30/2020  ?  ?PCP is Dr Heather Gill ? ?Current Medications, Allergies, Past Medical History, Past Surgical History, Family History and Social History were reviewed in [employer] record.   ? ? ?Review of Systems: ?Patient denies any headaches, hearing loss, fatigue, blurred vision, shortness of breath, chest pain, abdominal pain, problems with bowel movements, urination, or intercourse. No joint pain or mood swings.  ?Denies any vaginal bleeding  ? ? ?Physical Exam:BP 121/86 (BP Location: Right Arm, Patient Position: Sitting, Cuff Size: Normal)   Pulse (!) 104   Ht 5' 7.5" (1.715 m)   Wt 187 lb (84.8 kg)   BMI 28.86 kg/m?   ?General:  Well developed, well nourished, no acute distress ?Skin:  Warm and dry ?Neck:  Midline trachea, normal thyroid, good ROM, no lymphadenopathy,no carotid bruits heard  ?Lungs; Clear to auscultation bilaterally ?Breast:  No dominant palpable mass, retraction, or nipple discharge ?Cardiovascular: Regular rate and rhythm ?Abdomen:  Soft, non tender, no hepatosplenomegaly ?Pelvic:  External genitalia is normal in appearance, no lesions.  The vagina is pale with loss of rugae. Urethra has no lesions or masses. The cervix is smooth.  Uterus is felt to be normal size, shape, and contour.  No adnexal masses or tenderness noted.Bladder is non tender, no masses felt. ?Rectal: Good sphincter tone, no polyps, or hemorrhoids felt.  Hemoccult negative. ?Extremities/musculoskeletal:  No swelling or varicosities noted, no clubbing or cyanosis ?Psych:  No mood  changes, alert and cooperative,seems happy ?AA is 3 ?Fall risk is low ? ?  05/01/2021  ?  1:42 PM 04/30/2020  ?  2:48 PM 03/22/2017  ?  1:38 PM  ?Depression screen PHQ 2/9  ?Decreased Interest 0 0 0  ?Down, Depressed, Hopeless 0 0 0  ?PHQ - 2 Score 0 0 0  ?Altered sleeping 3 3   ?Tired, decreased energy 2 3   ?Change in appetite 0 0   ?Feeling bad or failure about yourself  0 0   ?Trouble concentrating 0 0   ?Moving slowly or fidgety/restless 0 0   ?Suicidal thoughts 0 0   ?PHQ-9 Score 5 6   ? She is on meds and sees a therapist  ? ?  05/01/2021  ?  1:42 PM 04/30/2020  ?  2:48 PM  ?GAD 7 : Generalized Anxiety Score  ?Nervous, Anxious, on Edge 0 0  ?Control/stop worrying 0 0  ?Worry too much - different things 0 0  ?Trouble relaxing 2 0  ?Restless 0 0  ?Easily annoyed or irritable 0 0  ?Afraid - awful might happen 0 0  ?Total GAD 7 Score 2 0  ?Examination chaperoned by Celene Squibb LPN ? ?  ? ?Impression and Plan: ?1. Encounter for well woman exam with routine gynecological exam ?Physical in 1 year ?Pap in 2025 ?Labs with PCP ?Mammogram yearly ?Colonoscopy per GI ? ? ?2. Encounter for screening fecal occult blood testing ? ? ? ? ?  ?  ?

## 2021-05-05 ENCOUNTER — Ambulatory Visit
Admission: EM | Admit: 2021-05-05 | Discharge: 2021-05-05 | Disposition: A | Discharge status: Home / Self Care | Payer: Medicare Other | Attending: Urgent Care | Admitting: Urgent Care

## 2021-05-05 ENCOUNTER — Other Ambulatory Visit: Payer: Self-pay

## 2021-05-05 DIAGNOSIS — J019 Acute sinusitis, unspecified: Secondary | ICD-10-CM

## 2021-05-05 DIAGNOSIS — R42 Dizziness and giddiness: Secondary | ICD-10-CM | POA: Diagnosis not present

## 2021-05-05 MED ORDER — LEVOCETIRIZINE DIHYDROCHLORIDE 5 MG PO TABS: 5.0000 mg | ORAL_TABLET | Freq: Every evening | ORAL | 0 refills | Status: DC

## 2021-05-05 MED ORDER — AMOXICILLIN 875 MG PO TABS: 875.0000 mg | ORAL_TABLET | Freq: Two times a day (BID) | ORAL | 0 refills | Status: DC

## 2021-05-05 MED ORDER — MECLIZINE HCL 12.5 MG PO TABS: 12.5000 mg | ORAL_TABLET | Freq: Three times a day (TID) | ORAL | 0 refills | Status: DC | PRN

## 2021-05-05 NOTE — ED Triage Notes (Signed)
Pt presents with c/o headache and dizziness for past couple weeks  ?

## 2021-05-05 NOTE — ED Provider Notes (Signed)
?[place] ? ? ?MRN: [id number] DOB: 01-18-1956 ? ?Subjective:  ? ?Heather Gill is a 66 y.o. female presenting for 2-week history of persistent sinus drainage, generalized headache and intermittent dizziness.  No fever, cough, chest pain, shortness of breath or wheezing, heart racing, palpitations.  Has a history of thyroid disease but is well controlled.  Does not have anemia.  Does not have diabetes.  No neck pain, stiffness, fevers.  No history of arrhythmias personal or family. ? ?No current facility-administered medications for this encounter. ? ?Current Outpatient Medications:  ?  cyclobenzaprine (FLEXERIL) 10 MG tablet, Take 10 mg by mouth 3 (three) times daily as needed., Disp: , Rfl:  ?  HYDROcodone-acetaminophen (NORCO/VICODIN) 5-325 MG per tablet, Take 1 tablet by mouth every 8 (eight) hours as needed. Pain, usually takes a benadryl wit it to control itching., Disp: , Rfl:  ?  latanoprost (XALATAN) 0.005 % ophthalmic solution, Place 1 drop into both eyes at bedtime., Disp: , Rfl:  ?  levothyroxine (SYNTHROID) 75 MCG tablet, Take 1 tablet (75 mcg total) by mouth daily before breakfast., Disp: 90 tablet, Rfl: 3 ?  ondansetron (ZOFRAN) 4 MG tablet, Take 4 mg by mouth every 6 (six) hours as needed., Disp: , Rfl:  ?  pantoprazole (PROTONIX) 40 MG tablet, Take 40 mg by mouth daily., Disp: , Rfl:  ?  promethazine (PHENERGAN) 25 MG tablet, Take 25 mg by mouth every 8 (eight) hours as needed for nausea or vomiting., Disp: , Rfl:  ?  traZODone (DESYREL) 100 MG tablet, Take 100 mg by mouth at bedtime., Disp: , Rfl:  ?  valACYclovir (VALTREX) 1000 MG tablet, TAKE TWO TABLETS BY MOUTH TODAY AND TWO TABLETS IN THE MORNING FOR COLD SORES ASNEEDED, Disp: 10 tablet, Rfl: prn ?  venlafaxine XR (EFFEXOR-XR) 75 MG 24 hr capsule, Take 75 mg by mouth daily., Disp: , Rfl:  ?  zolpidem (AMBIEN) 10 MG tablet, Take 10 mg by mouth at bedtime., Disp: , Rfl:   ? ?Allergies  ?Allergen Reactions  ? Demerol  [Meperidine] Itching  ? Lortab [Hydrocodone-Acetaminophen] Itching  ? Morphine And Related Itching  ? Percocet [Oxycodone-Acetaminophen] Itching  ? ? ?Past Medical History:  ?Diagnosis Date  ? Anxiety   ? Blood in urine   ? Dr Maryland Pink told her 6 yrs ago-no problems  ? Depression   ? Glaucoma   ? Hormone replacement therapy (HRT)   ? Hx of spinal fusion   ? Hypertension   ? Hypothyroid 01/22/2015  ? Hypothyroidism   ? Osteopenia   ? Raynauds syndrome   ? Thyroid nodule   ? Urinary incontinence 02/23/2013  ?  ? ?Past Surgical History:  ?Procedure Laterality Date  ? BACK SURGERY    ? 3 surg-last with fusion and rods  ? BACK SURGERY    ? COLONOSCOPY N/A 03/16/2012  ? Procedure: COLONOSCOPY;  Surgeon: Rogene Houston, MD;  [facility];  Service: Endoscopy;  Laterality: N/A;  730  ? DIAGNOSTIC LAPAROSCOPY    ? dx  ? FOOT TENDON SURGERY    ? TUBAL LIGATION    ? ? ?Family History  ?Problem Relation Age of Onset  ? Cancer Mother   ?     colon  ? Varicose Veins Mother   ? Stroke Father   ? Hypertension Sister   ? Cancer Paternal Aunt   ? Cancer Paternal Uncle   ? Diabetes Brother   ? Cancer Maternal Aunt   ? Cancer Maternal  Uncle   ? ? ?Social History  ? ?Tobacco Use  ? Smoking status: Never  ? Smokeless tobacco: Never  ?Vaping Use  ? Vaping Use: Never used  ?Substance Use Topics  ? Alcohol use: Yes  ?  Alcohol/week: 4.0 standard drinks  ?  Types: 4 Glasses of wine per week  ?  Comment: wine 2-3 glasses 2-3 days out of the week  ? Drug use: No  ? ? ?ROS ? ? ?Objective:  ? ?Vitals: ?BP 133/87   Pulse 85   Temp 98.9 ?F (37.2 ?C)   Resp 20   SpO2 95%  ? ?Physical Exam ?Constitutional:   ?   General: She is not in acute distress. ?   Appearance: Normal appearance. She is well-developed and normal weight. She is not ill-appearing, toxic-appearing or diaphoretic.  ?HENT:  ?   Head: Normocephalic and atraumatic.  ?   Right Ear: Tympanic membrane, ear canal and external ear normal. No drainage or tenderness. No middle  ear effusion. There is no impacted cerumen. Tympanic membrane is not erythematous.  ?   Left Ear: Tympanic membrane, ear canal and external ear normal. No drainage or tenderness.  No middle ear effusion. There is no impacted cerumen. Tympanic membrane is not erythematous.  ?   Nose: Nose normal. No congestion or rhinorrhea.  ?   Mouth/Throat:  ?   Mouth: Mucous membranes are moist. No oral lesions.  ?   Pharynx: No pharyngeal swelling, oropharyngeal exudate, posterior oropharyngeal erythema or uvula swelling.  ?   Tonsils: No tonsillar exudate or tonsillar abscesses.  ?Eyes:  ?   General: No scleral icterus.    ?   Right eye: No discharge.     ?   Left eye: No discharge.  ?   Extraocular Movements: Extraocular movements intact.  ?   Right eye: Normal extraocular motion.  ?   Left eye: Normal extraocular motion.  ?   Conjunctiva/sclera: Conjunctivae normal.  ?   Pupils: Pupils are equal, round, and reactive to light.  ?Cardiovascular:  ?   Rate and Rhythm: Normal rate.  ?   Heart sounds: No murmur heard. ?  No friction rub. No gallop.  ?Pulmonary:  ?   Effort: Pulmonary effort is normal. No respiratory distress.  ?   Breath sounds: No stridor. No wheezing, rhonchi or rales.  ?Chest:  ?   Chest wall: No tenderness.  ?Musculoskeletal:  ?   Cervical back: Normal range of motion and neck supple. No rigidity.  ?Lymphadenopathy:  ?   Cervical: No cervical adenopathy.  ?Skin: ?   General: Skin is warm and dry.  ?Neurological:  ?   General: No focal deficit present.  ?   Mental Status: She is alert and oriented to person, place, and time.  ?   Cranial Nerves: No cranial nerve deficit, dysarthria or facial asymmetry.  ?   Motor: No weakness.  ?   Coordination: Coordination normal.  ?   Gait: Gait normal.  ?Psychiatric:     ?   Mood and Affect: Mood normal.     ?   Behavior: Behavior normal.  ? ? ?Assessment and Plan :  ? ?PDMP not reviewed this encounter. ? ?1. Acute non-recurrent sinusitis, unspecified location   ?2.  Dizziness   ? ?Patient has a reassuring physical exam, hemodynamically stable vital signs.  I suspect that her dizziness and headaches, drainage is secondary to a sinus infection.  I discussed this with patient including etiologies  for dizziness and she was agreeable to trial a round of antibiotics for this.  We will be using amoxicillin.  Recommend supportive care otherwise.  She plans on following up with her PCP for her regular physical in the very near future and plans on having blood work done with them, would like to defer today. Counseled patient on potential for adverse effects with medications prescribed/recommended today, ER and return-to-clinic precautions discussed, patient verbalized understanding. ? ?  ?Jaynee Eagles, PA-C ?05/05/21 1616 ? ?

## 2021-05-23 ENCOUNTER — Ambulatory Visit (HOSPITAL_COMMUNITY)
Admission: RE | Admit: 2021-05-23 | Discharge: 2021-05-23 | Disposition: A | Discharge status: Home / Self Care | Payer: Medicare Other | Source: Ambulatory Visit | Attending: Internal Medicine | Admitting: Internal Medicine

## 2021-05-23 DIAGNOSIS — Z1231 Encounter for screening mammogram for malignant neoplasm of breast: Secondary | ICD-10-CM

## 2021-09-04 DIAGNOSIS — Z0000 Encounter for general adult medical examination without abnormal findings: Secondary | ICD-10-CM | POA: Diagnosis not present

## 2021-09-09 DIAGNOSIS — E785 Hyperlipidemia, unspecified: Secondary | ICD-10-CM | POA: Diagnosis not present

## 2021-09-09 DIAGNOSIS — Z1159 Encounter for screening for other viral diseases: Secondary | ICD-10-CM | POA: Diagnosis not present

## 2021-09-09 DIAGNOSIS — Z79899 Other long term (current) drug therapy: Secondary | ICD-10-CM | POA: Diagnosis not present

## 2021-09-12 ENCOUNTER — Telehealth: Payer: Self-pay | Admitting: "Endocrinology

## 2021-09-12 NOTE — Telephone Encounter (Signed)
Patient called and said she seen her PCP today and they were asking about  bone density. She would like to know if she is due to have one?

## 2021-09-12 NOTE — Telephone Encounter (Signed)
Discussed with pt her last DEXA scan I see was in 2019. Pt has an upcoming appt in January with [place]. Do you want her to get a DEXA scan prior to [place]?

## 2021-09-15 ENCOUNTER — Other Ambulatory Visit: Payer: Self-pay

## 2021-09-15 DIAGNOSIS — M8580 Other specified disorders of bone density and structure, unspecified site: Secondary | ICD-10-CM

## 2021-09-15 NOTE — Telephone Encounter (Signed)
See below. She needs a DEXA

## 2021-09-15 NOTE — Telephone Encounter (Signed)
Order for bone density study placed. Pt made aware she is scheduled for study on August 14th at 10:00 at [facility] will call them to reschedule if there is a conflict with date or time.

## 2021-09-22 ENCOUNTER — Other Ambulatory Visit (HOSPITAL_COMMUNITY): Payer: Medicare Other

## 2021-09-29 ENCOUNTER — Ambulatory Visit (HOSPITAL_COMMUNITY)
Admission: RE | Admit: 2021-09-29 | Discharge: 2021-09-29 | Disposition: A | Discharge status: Home / Self Care | Payer: Medicare Other | Source: Ambulatory Visit | Attending: "Endocrinology | Admitting: "Endocrinology

## 2021-09-29 DIAGNOSIS — Z780 Asymptomatic menopausal state: Secondary | ICD-10-CM | POA: Insufficient documentation

## 2021-09-29 DIAGNOSIS — M8589 Other specified disorders of bone density and structure, multiple sites: Secondary | ICD-10-CM | POA: Diagnosis not present

## 2021-09-29 DIAGNOSIS — M85852 Other specified disorders of bone density and structure, left thigh: Secondary | ICD-10-CM | POA: Diagnosis not present

## 2021-09-29 DIAGNOSIS — M8580 Other specified disorders of bone density and structure, unspecified site: Secondary | ICD-10-CM | POA: Insufficient documentation

## 2021-09-29 DIAGNOSIS — Z13820 Encounter for screening for osteoporosis: Secondary | ICD-10-CM | POA: Insufficient documentation

## 2021-12-16 ENCOUNTER — Other Ambulatory Visit: Payer: Self-pay | Admitting: Adult Health

## 2022-01-29 IMAGING — MG DIGITAL SCREENING BILAT W/ TOMO W/ CAD
8 series · 8 of 24 positions shown · non-contrast
Comparison: Previous exam(s).

ACR Breast Density Category a: The breast tissue is almost entirely
fatty.

CLINICAL DATA: Screening.

EXAM:
DIGITAL SCREENING BILATERAL MAMMOGRAM WITH TOMO AND CAD

[R MLO synth-2D]
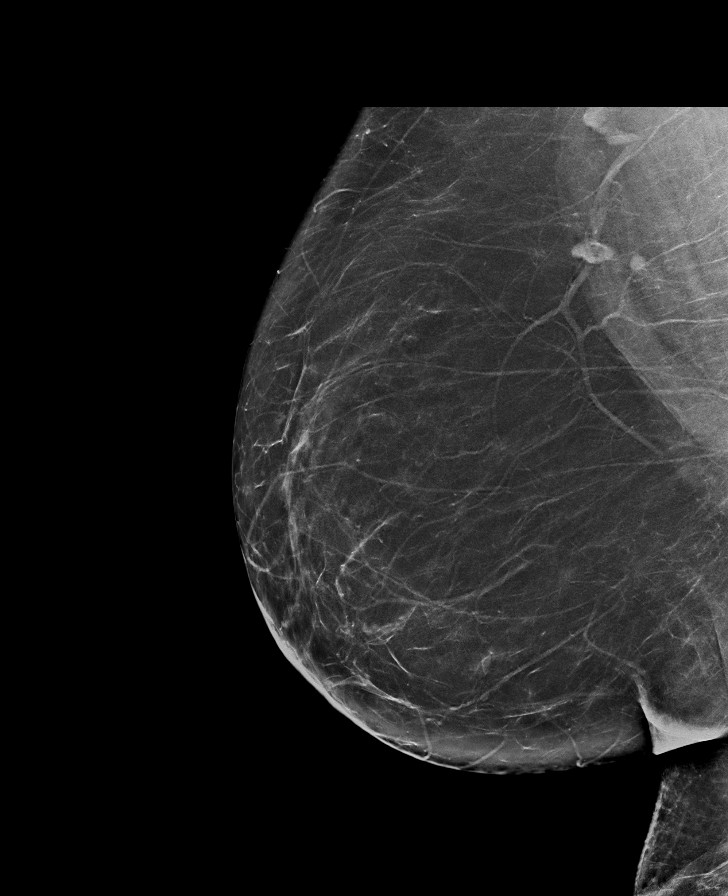

[R CC synth-2D]
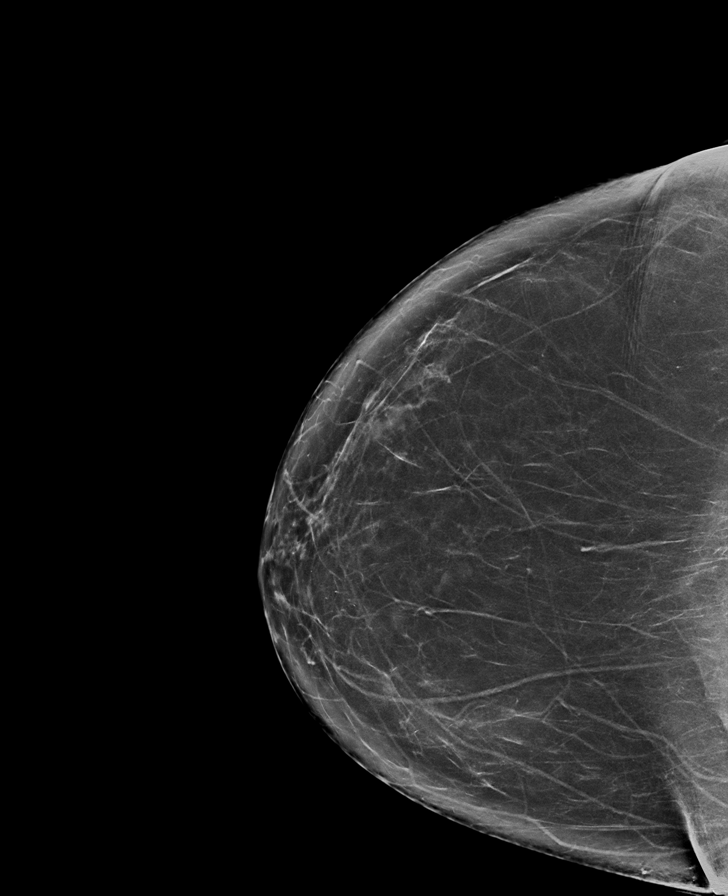

[L MLO synth-2D]
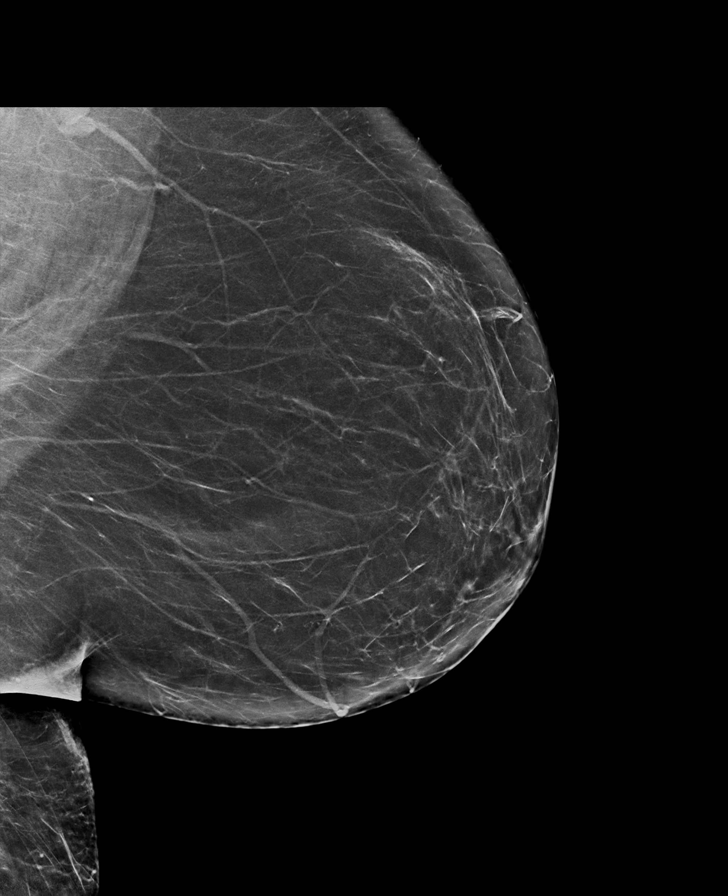

[L CC synth-2D]
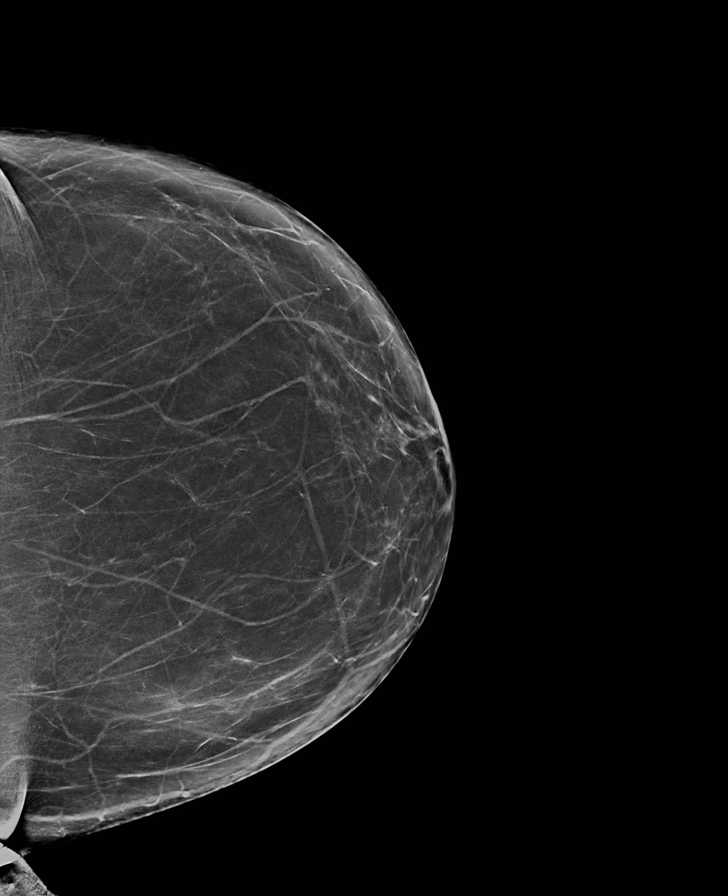

[L CC tomo · tomo slice 37/73.0]
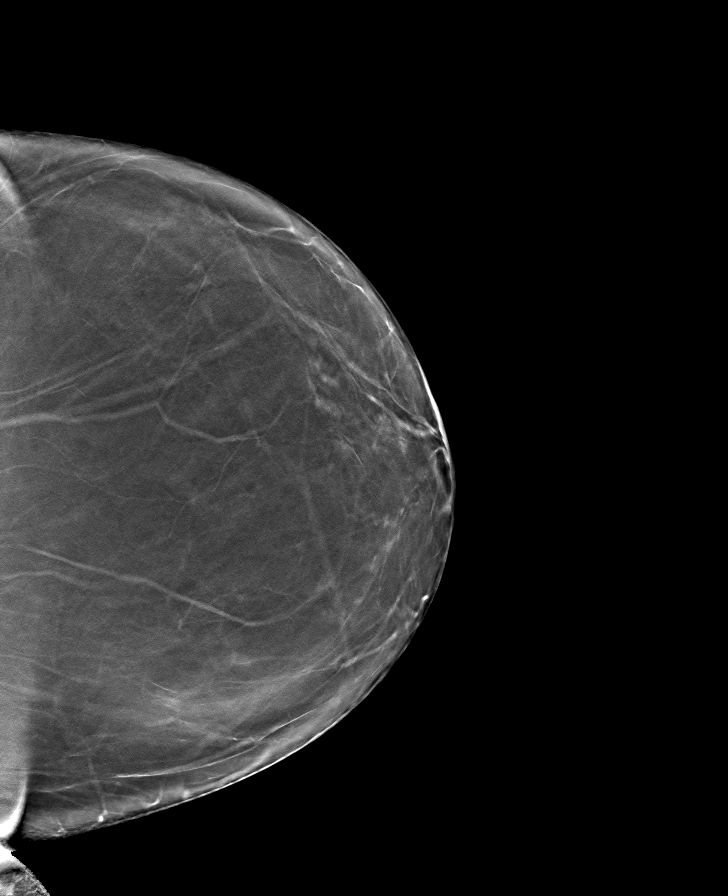

[R MLO tomo · tomo slice 40/79.0]
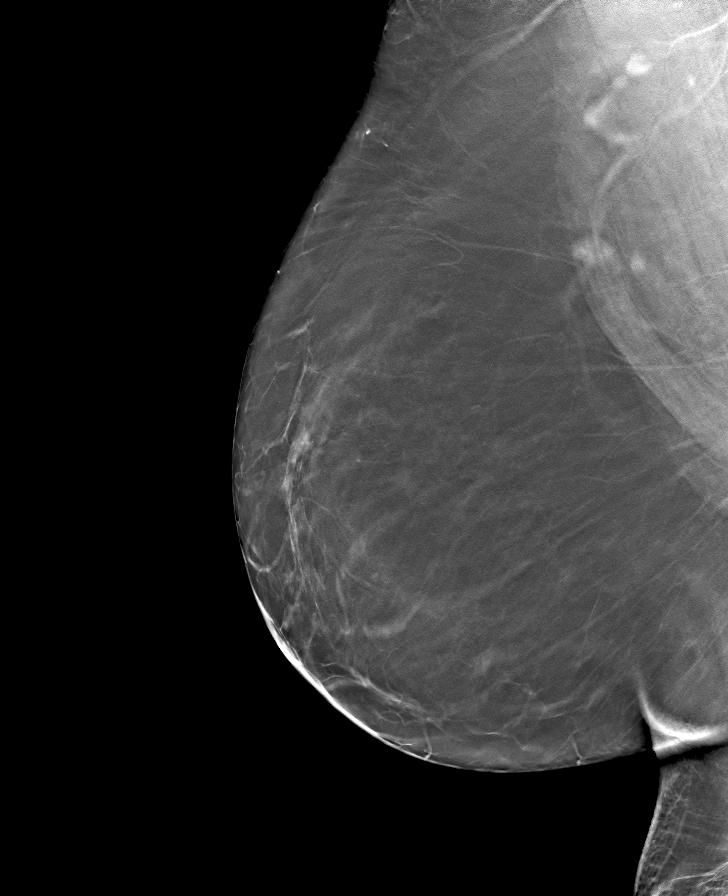

[R CC tomo · tomo slice 40/79.0]
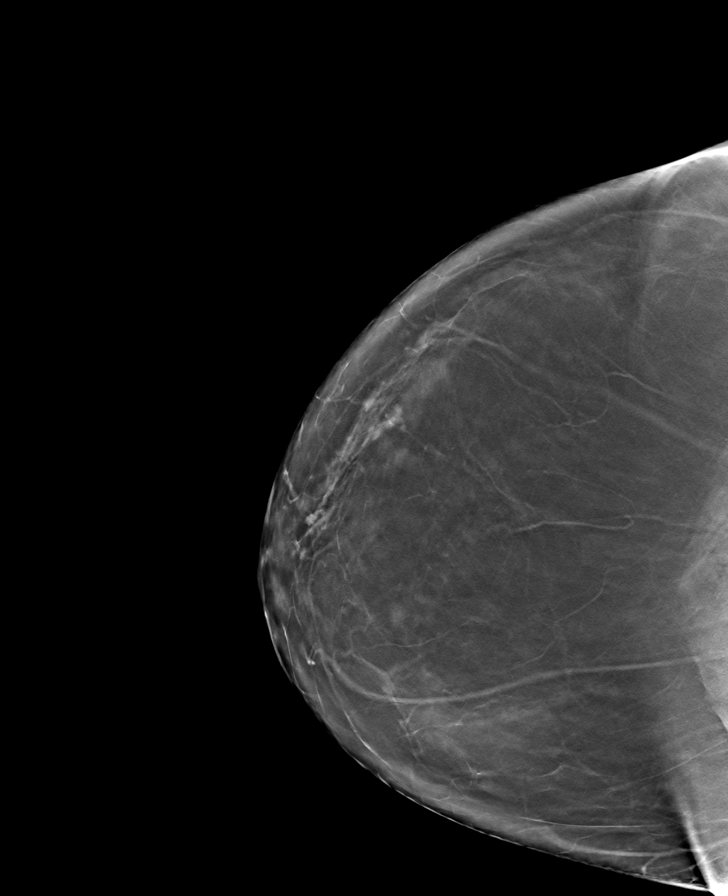

[L MLO tomo · tomo slice 39/77.0]
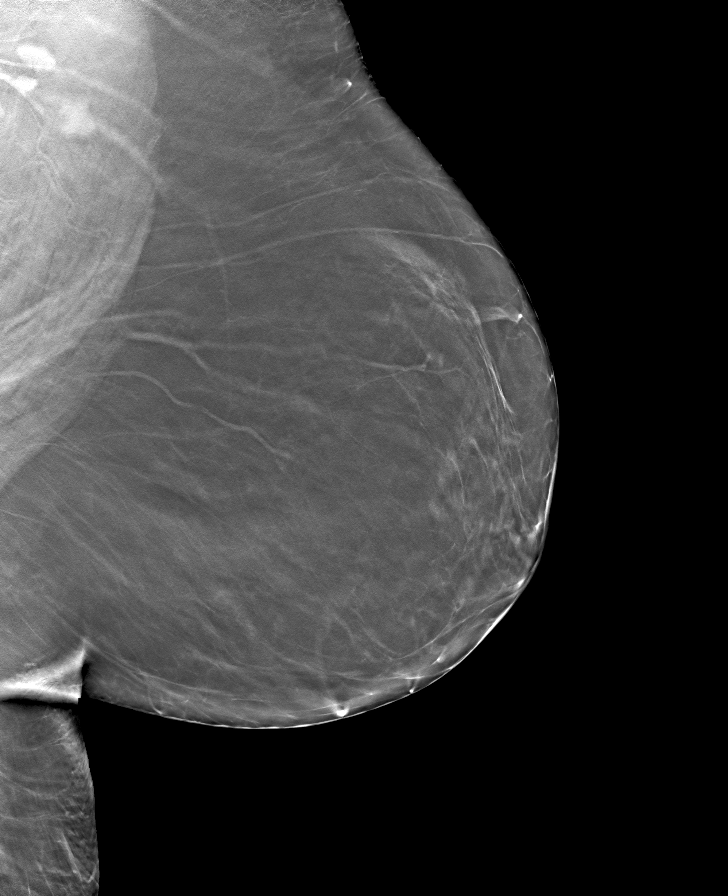

[8 of 24 positions shown; findings below may reference images not displayed]

FINDINGS: There are no findings suspicious for malignancy. Images were
processed with CAD.
IMPRESSION: No mammographic evidence of malignancy. A result letter of this
screening mammogram will be mailed directly to the patient.

RECOMMENDATION:
Screening mammogram in one year. (Code:[id number]-[id number])

BI-RADS CATEGORY  1: Negative.

## 2022-02-23 ENCOUNTER — Telehealth: Payer: Self-pay | Admitting: Nurse Practitioner

## 2022-02-23 ENCOUNTER — Encounter (INDEPENDENT_AMBULATORY_CARE_PROVIDER_SITE_OTHER): Payer: Self-pay | Admitting: *Deleted

## 2022-02-23 DIAGNOSIS — E039 Hypothyroidism, unspecified: Secondary | ICD-10-CM

## 2022-02-23 NOTE — Telephone Encounter (Signed)
done

## 2022-02-23 NOTE — Telephone Encounter (Signed)
Can you update pt's labs?

## 2022-03-03 ENCOUNTER — Ambulatory Visit: Payer: Medicare Other | Admitting: Nurse Practitioner

## 2022-03-18 DIAGNOSIS — E039 Hypothyroidism, unspecified: Secondary | ICD-10-CM | POA: Diagnosis not present

## 2022-03-19 DIAGNOSIS — M79645 Pain in left finger(s): Secondary | ICD-10-CM | POA: Diagnosis not present

## 2022-03-19 LAB — T4, FREE: Free T4: 1.17 ng/dL (ref 0.82–1.77)

## 2022-03-19 LAB — TSH: TSH: 0.964 u[IU]/mL (ref 0.450–4.500)

## 2022-03-24 NOTE — Patient Instructions (Signed)

## 2022-03-25 ENCOUNTER — Encounter: Payer: Self-pay | Admitting: Nurse Practitioner

## 2022-03-25 ENCOUNTER — Ambulatory Visit: Payer: Medicare Other | Admitting: Nurse Practitioner

## 2022-03-25 VITALS — BP 124/86 | HR 67 | Ht 67.5 in | Wt 192.2 lb

## 2022-03-25 DIAGNOSIS — E049 Nontoxic goiter, unspecified: Secondary | ICD-10-CM | POA: Diagnosis not present

## 2022-03-25 DIAGNOSIS — M8580 Other specified disorders of bone density and structure, unspecified site: Secondary | ICD-10-CM | POA: Diagnosis not present

## 2022-03-25 DIAGNOSIS — E039 Hypothyroidism, unspecified: Secondary | ICD-10-CM

## 2022-03-25 MED ORDER — LEVOTHYROXINE SODIUM 75 MCG PO TABS: 75.0000 ug | ORAL_TABLET | Freq: Every day | ORAL | 3 refills | Status: DC

## 2022-03-25 NOTE — Progress Notes (Signed)
03/25/2022                                  Endocrinology Follow Up Visit   Subjective:    Patient ID: Heather Gill, female    DOB: May 12, 1955, PCP Asencion Noble, MD   Past Medical History:  Diagnosis Date   Anxiety    Blood in urine    Dr Maryland Pink told her 6 yrs ago-no problems   Depression    Glaucoma    Hormone replacement therapy (HRT)    Hx of spinal fusion    Hypertension    Hypothyroid 01/22/2015   Hypothyroidism    Osteopenia    Raynauds syndrome    Thyroid nodule    Urinary incontinence 02/23/2013   Past Surgical History:  Procedure Laterality Date   BACK SURGERY     3 surg-last with fusion and rods   BACK SURGERY     COLONOSCOPY N/A 03/16/2012   Procedure: COLONOSCOPY;  Surgeon: Rogene Houston, MD;  [facility];  Service: Endoscopy;  Laterality: N/A;  730   DIAGNOSTIC LAPAROSCOPY     dx   FOOT TENDON SURGERY     TUBAL LIGATION     Social History   Socioeconomic History   Marital status: Married    Spouse name: Gwyndolyn Saxon   Number of children: 2   Years of education: BA   Highest education level: Not on file  Occupational History    Employer: OTHER    Comment: disabled  Tobacco Use   Smoking status: Never   Smokeless tobacco: Never  Vaping Use   Vaping Use: Never used  Substance and Sexual Activity   Alcohol use: Yes    Alcohol/week: 4.0 standard drinks of alcohol    Types: 4 Glasses of wine per week    Comment: wine 2-3 glasses 2-3 days out of the week   Drug use: No   Sexual activity: Yes    Birth control/protection: Post-menopausal, Surgical    Comment: tubal  Other Topics Concern   Not on file  Social History Narrative   Patient lives at home with spouse.   Caffeine Use: 3 [employer] 12oz. can daily   Social Determinants of Health   Financial Resource Strain: Low Risk  (05/01/2021)   Overall Financial Resource Strain (CARDIA)    Difficulty of Paying Living Expenses: Not very hard  Food Insecurity: No Food Insecurity  (05/01/2021)   Hunger Vital Sign    Worried About Running Out of Food in the Last Year: Never true    Ran Out of Food in the Last Year: Never true  Transportation Needs: No Transportation Needs (05/01/2021)   PRAPARE - Hydrologist (Medical): No    Lack of Transportation (Non-Medical): No  Physical Activity: Inactive (05/01/2021)   Exercise Vital Sign    Days of Exercise per Week: 0 days    Minutes of Exercise per Session: 0 min  Stress: No Stress Concern Present (05/01/2021)   [place]    Feeling of Stress : Not at all  Social Connections: Moderately Integrated (05/01/2021)   Social Connection and Isolation Panel [NHANES]    Frequency of Communication with Friends and Family: More than three times a week    Frequency of Social Gatherings with Friends and Family: Once a week    Attends Religious Services: 1 to  4 times per year    Active Member of Clubs or Organizations: No    Attends Archivist Meetings: Never    Marital Status: Married   Outpatient Encounter Medications as of 03/25/2022  Medication Sig   cyclobenzaprine (FLEXERIL) 10 MG tablet Take 10 mg by mouth 3 (three) times daily as needed.   HYDROcodone-acetaminophen (NORCO/VICODIN) 5-325 MG per tablet Take 1 tablet by mouth every 8 (eight) hours as needed. Pain, usually takes a benadryl wit it to control itching.   latanoprost (XALATAN) 0.005 % ophthalmic solution Place 1 drop into both eyes at bedtime.   ondansetron (ZOFRAN) 4 MG tablet Take 4 mg by mouth every 6 (six) hours as needed.   pantoprazole (PROTONIX) 40 MG tablet Take 40 mg by mouth daily.   promethazine (PHENERGAN) 25 MG tablet Take 25 mg by mouth every 8 (eight) hours as needed for nausea or vomiting.   traZODone (DESYREL) 100 MG tablet Take 100 mg by mouth at bedtime.   valACYclovir (VALTREX) 1000 MG tablet TAKE TWO TABLETS BY MOUTH TODAY AND TWO TABLETS IN THE  MORNING FOR COLD SORES ASNEEDED   venlafaxine XR (EFFEXOR-XR) 75 MG 24 hr capsule Take 75 mg by mouth daily.   zolpidem (AMBIEN) 10 MG tablet Take 10 mg by mouth at bedtime.   [DISCONTINUED] levothyroxine (SYNTHROID) 75 MCG tablet Take 1 tablet (75 mcg total) by mouth daily before breakfast.   levothyroxine (SYNTHROID) 75 MCG tablet Take 1 tablet (75 mcg total) by mouth daily before breakfast.   [DISCONTINUED] amoxicillin (AMOXIL) 875 MG tablet Take 1 tablet (875 mg total) by mouth 2 (two) times daily.   [DISCONTINUED] levocetirizine (XYZAL) 5 MG tablet Take 1 tablet (5 mg total) by mouth every evening.   [DISCONTINUED] meclizine (ANTIVERT) 12.5 MG tablet Take 1 tablet (12.5 mg total) by mouth 3 (three) times daily as needed for dizziness.   No facility-administered encounter medications on file as of 03/25/2022.   ALLERGIES: Allergies  Allergen Reactions   Demerol [Meperidine] Itching   Lortab [Hydrocodone-Acetaminophen] Itching   Morphine And Related Itching   Percocet [Oxycodone-Acetaminophen] Itching   VACCINATION STATUS:  There is no immunization history on file for this patient.  Thyroid Problem Presents for follow-up visit. Patient reports no anxiety, cold intolerance, constipation, depressed mood, diarrhea, fatigue, heat intolerance, leg swelling, palpitations, tremors, weight gain or weight loss. The symptoms have been stable.    67 year old female patient with medical history as above.  She is returning for follow-up of hypothyroidism.    -She remains on Levothyroxine 75 mcg p.o. daily before breakfast.  She reports compliance to her medication.  She has no new complaints.    -She did have thyroid ultrasound in 2010 which showed right lobe of thyroid was 4.3 cm and left lobe was 3.6 cm with no discrete nodules.  -Her repeat bone density in August 2023 is essentially unchanged from previous DEXA scan showing osteopenia.  She gives a remote history of stress fracture.  She  remains on Vitamin D supplement.  Review of systems  Constitutional: + Minimally fluctuating body weight,  current Body mass index is 29.66 kg/m. , no fatigue, no subjective hyperthermia, no subjective hypothermia Eyes: no blurry vision, no xerophthalmia ENT: no sore throat, no nodules palpated in throat, no dysphagia/odynophagia, no hoarseness Cardiovascular: no chest pain, no shortness of breath, no palpitations, no leg swelling Respiratory: no cough, no shortness of breath Gastrointestinal: no nausea/vomiting/diarrhea Musculoskeletal: no muscle/joint aches Skin: no rashes, no hyperemia Neurological: no  tremors, no numbness, no tingling, no dizziness Psychiatric: no depression, no anxiety  Objective:    BP 124/86 (BP Location: Right Arm, Patient Position: Sitting, Cuff Size: Large)   Pulse 67   Ht 5' 7.5" (1.715 m)   Wt 192 lb 3.2 oz (87.2 kg)   BMI 29.66 kg/m   Wt Readings from Last 3 Encounters:  03/25/22 192 lb 3.2 oz (87.2 kg)  05/01/21 187 lb (84.8 kg)  02/28/21 191 lb (86.6 kg)    BP Readings from Last 3 Encounters:  03/25/22 124/86  05/05/21 133/87  05/01/21 121/86      Physical Exam- Limited  Constitutional:  Body mass index is 29.66 kg/m. , not in acute distress, normal state of mind Eyes:  EOMI, no exophthalmos Musculoskeletal: no gross deformities, strength intact in all four extremities, no gross restriction of joint movements Skin:  no rashes, no hyperemia Neurological: no tremor with outstretched hands    Thyroid US 03/31/19   CLINICAL DATA:  Goiter.  On Synthroid   EXAM: THYROID ULTRASOUND   TECHNIQUE: Ultrasound examination of the thyroid gland and adjacent soft tissues was performed.   COMPARISON:  09/15/2016   FINDINGS: Parenchymal Echotexture: Mildly heterogenous   Isthmus: 0.3 cm thickness, previously 0.2   Right lobe: 4.4 x 0.7 x 1.4 cm, previously 4.3 x 1.6 x 0.9   Left lobe: 4.3 x 0.7 x 0.8 cm, previously 4.6 x 1.4 x 0.9    _________________________________________________________   Estimated total number of nodules >/= 1 cm: 0   Number of spongiform nodules >/=  2 cm not described below (TR1): 0   Number of mixed cystic and solid nodules >/= 1.5 cm not described below ([place]): 0   _________________________________________________________   No discrete nodules are seen within the thyroid gland. 0.5 cm benign thyroid cyst, mid right.   IMPRESSION: Unremarkable thyroid. No nodule or other indication for biopsy or dedicated imaging follow-up.   The above is in keeping with the ACR TI-RADS recommendations - J Am Coll Radiol 2017;14:587-595.     Electronically Signed   By: Lucrezia Europe M.D.   On: 03/31/2019 16:56    Latest Reference Range & Units 02/22/20 11:27 03/15/20 10:49 02/21/21 11:01 03/18/22 11:42  TSH 0.450 - 4.500 uIU/mL 1.390 1.290 2.080 0.964  T4,Free(Direct) 0.82 - 1.77 ng/dL 1.28 1.28 1.15 1.17   Assessment & Plan:   1.  Hypothyroidism, unspecified -Her previsit thyroid function tests are consistent with appropriate hormone replacement.  She is advised to continue Levothyroxine 75 mcg po daily before breakfast.    - We discussed about the correct intake of her thyroid hormone, on empty stomach at fasting, with water, separated by at least 30 minutes from breakfast and other medications,  and separated by more than 4 hours from calcium, iron, multivitamins, acid reflux medications (PPIs). -Patient is made aware of the fact that thyroid hormone replacement is needed for life, dose to be adjusted by periodic monitoring of thyroid function tests.  Her most recent thyroid ultrasound on 03/31/2019 did not show any discrete nodules.  There is no need for further follow up imaging at this time.  2. Osteopenia  Recent DEXA scan August 2023 essentially unchanged, showing osteopenia.  She remains on Vitamin D supplement daily.    - I advised patient to maintain close follow up with Asencion Noble,  MD for primary care needs.     I spent  15  minutes in the care of the patient today including review of  labs from Thyroid Function, CMP, and other relevant labs ; imaging/biopsy records (current and previous including abstractions from other facilities); face-to-face time discussing  her lab results and symptoms, medications doses, her options of short and long term treatment based on the latest standards of care / guidelines;   and documenting the encounter.  Sherrie George  participated in the discussions, expressed understanding, and voiced agreement with the above plans.  All questions were answered to her satisfaction. she is encouraged to contact clinic should she have any questions or concerns prior to her return visit.   Follow up plan: Return in about 1 year (around 03/26/2023) for Thyroid follow up, Previsit labs.  Rayetta Pigg, [facility] [address] [place], [place] [address] Phone: [phone] Fax: [phone]   03/25/2022, 3:43 PM

## 2022-04-21 DIAGNOSIS — H401131 Primary open-angle glaucoma, bilateral, mild stage: Secondary | ICD-10-CM | POA: Diagnosis not present

## 2022-06-25 ENCOUNTER — Other Ambulatory Visit: Payer: Self-pay | Admitting: Adult Health

## 2022-07-27 ENCOUNTER — Other Ambulatory Visit: Payer: Self-pay | Admitting: Adult Health

## 2022-08-25 ENCOUNTER — Other Ambulatory Visit: Payer: Self-pay | Admitting: Adult Health

## 2022-09-24 ENCOUNTER — Other Ambulatory Visit: Payer: Self-pay | Admitting: Adult Health

## 2022-09-25 ENCOUNTER — Other Ambulatory Visit: Payer: Self-pay | Admitting: Obstetrics & Gynecology

## 2022-09-25 ENCOUNTER — Telehealth: Payer: Self-pay | Admitting: *Deleted

## 2022-09-25 MED ORDER — ZOLPIDEM TARTRATE 10 MG PO TABS: 10.0000 mg | ORAL_TABLET | Freq: Every day | ORAL | 1 refills | Status: DC

## 2022-09-25 NOTE — Telephone Encounter (Signed)
Pt is leaving for vacation and is requesting an Ambien refill. Thanks! JSY

## 2022-11-20 ENCOUNTER — Other Ambulatory Visit (HOSPITAL_COMMUNITY): Payer: Self-pay | Admitting: Internal Medicine

## 2022-11-20 DIAGNOSIS — Z1231 Encounter for screening mammogram for malignant neoplasm of breast: Secondary | ICD-10-CM

## 2022-11-24 ENCOUNTER — Other Ambulatory Visit: Payer: Self-pay | Admitting: Adult Health

## 2022-11-26 ENCOUNTER — Inpatient Hospital Stay (HOSPITAL_COMMUNITY): Admission: RE | Admit: 2022-11-26 | Payer: Medicare Other | Source: Ambulatory Visit

## 2022-11-26 DIAGNOSIS — Z1231 Encounter for screening mammogram for malignant neoplasm of breast: Secondary | ICD-10-CM

## 2022-12-03 ENCOUNTER — Ambulatory Visit (HOSPITAL_COMMUNITY)
Admission: RE | Admit: 2022-12-03 | Discharge: 2022-12-03 | Disposition: A | Discharge status: Home / Self Care | Payer: Medicare Other | Source: Ambulatory Visit | Attending: Internal Medicine | Admitting: Internal Medicine

## 2022-12-03 ENCOUNTER — Encounter (HOSPITAL_COMMUNITY): Payer: Self-pay

## 2022-12-03 DIAGNOSIS — Z1231 Encounter for screening mammogram for malignant neoplasm of breast: Secondary | ICD-10-CM | POA: Insufficient documentation

## 2022-12-23 ENCOUNTER — Other Ambulatory Visit: Payer: Self-pay | Admitting: Adult Health

## 2022-12-24 ENCOUNTER — Ambulatory Visit: Payer: Medicare Other | Admitting: Adult Health

## 2023-01-20 ENCOUNTER — Other Ambulatory Visit: Payer: Self-pay | Admitting: Adult Health

## 2023-01-27 ENCOUNTER — Ambulatory Visit: Payer: Medicare Other | Admitting: Orthopedic Surgery

## 2023-02-09 ENCOUNTER — Ambulatory Visit: Payer: Medicare Other | Admitting: Adult Health

## 2023-02-24 ENCOUNTER — Other Ambulatory Visit: Payer: Self-pay | Admitting: Adult Health

## 2023-03-10 ENCOUNTER — Ambulatory Visit: Payer: Medicare Other | Admitting: Adult Health

## 2023-03-10 ENCOUNTER — Other Ambulatory Visit: Payer: Self-pay | Admitting: Adult Health

## 2023-03-15 ENCOUNTER — Telehealth: Payer: Self-pay | Admitting: Nurse Practitioner

## 2023-03-15 NOTE — Telephone Encounter (Signed)
Pt has an appt next week but no labs are placed. Please advise if pt needs labs

## 2023-03-16 ENCOUNTER — Other Ambulatory Visit: Payer: Self-pay | Admitting: Nurse Practitioner

## 2023-03-16 DIAGNOSIS — E039 Hypothyroidism, unspecified: Secondary | ICD-10-CM

## 2023-03-16 DIAGNOSIS — E049 Nontoxic goiter, unspecified: Secondary | ICD-10-CM

## 2023-03-16 DIAGNOSIS — M8580 Other specified disorders of bone density and structure, unspecified site: Secondary | ICD-10-CM

## 2023-03-16 NOTE — Telephone Encounter (Signed)
I have entered some labs.

## 2023-03-24 ENCOUNTER — Ambulatory Visit: Payer: Medicare Other | Admitting: Nurse Practitioner

## 2023-03-27 ENCOUNTER — Other Ambulatory Visit: Payer: Self-pay | Admitting: Nurse Practitioner

## 2023-03-27 DIAGNOSIS — E039 Hypothyroidism, unspecified: Secondary | ICD-10-CM

## 2023-04-13 ENCOUNTER — Other Ambulatory Visit: Payer: Self-pay | Admitting: Adult Health

## 2023-04-21 ENCOUNTER — Other Ambulatory Visit (HOSPITAL_COMMUNITY)
Admission: RE | Admit: 2023-04-21 | Discharge: 2023-04-21 | Disposition: A | Discharge status: Home / Self Care | Source: Ambulatory Visit | Attending: Adult Health | Admitting: Adult Health

## 2023-04-21 ENCOUNTER — Ambulatory Visit: Payer: Medicare Other | Admitting: Adult Health

## 2023-04-21 ENCOUNTER — Encounter: Payer: Self-pay | Admitting: Adult Health

## 2023-04-21 VITALS — BP 126/85 | HR 93 | Ht 67.5 in | Wt 191.0 lb

## 2023-04-21 DIAGNOSIS — Z1151 Encounter for screening for human papillomavirus (HPV): Secondary | ICD-10-CM | POA: Diagnosis not present

## 2023-04-21 DIAGNOSIS — E041 Nontoxic single thyroid nodule: Secondary | ICD-10-CM | POA: Diagnosis not present

## 2023-04-21 DIAGNOSIS — F419 Anxiety disorder, unspecified: Secondary | ICD-10-CM

## 2023-04-21 DIAGNOSIS — Z1331 Encounter for screening for depression: Secondary | ICD-10-CM

## 2023-04-21 DIAGNOSIS — Z1211 Encounter for screening for malignant neoplasm of colon: Secondary | ICD-10-CM | POA: Diagnosis not present

## 2023-04-21 DIAGNOSIS — Z01419 Encounter for gynecological examination (general) (routine) without abnormal findings: Secondary | ICD-10-CM | POA: Diagnosis not present

## 2023-04-21 DIAGNOSIS — Z0000 Encounter for general adult medical examination without abnormal findings: Secondary | ICD-10-CM

## 2023-04-21 DIAGNOSIS — F32A Depression, unspecified: Secondary | ICD-10-CM | POA: Diagnosis not present

## 2023-04-21 LAB — HEMOCCULT GUIAC POC 1CARD (OFFICE): Fecal Occult Blood, POC: NEGATIVE

## 2023-04-21 NOTE — Progress Notes (Signed)
 Patient ID: Heather Gill, female   DOB: 12-30-1955, 68 y.o.   MRN: [id number] History of Present Illness: Heather Gill is a 68 year old white female, married, PM in for a well woman gyn exam and pap.  PCP is Heather Gill   Current Medications, Allergies, Past Medical History, Past Surgical History, Family History and Social History were reviewed in [employer] electronic medical record.     Review of Systems: Patient denies any headaches, hearing loss, fatigue, blurred vision, shortness of breath, chest pain, abdominal pain, problems with bowel movements, urination, or intercourse. No joint pain or Gill swings.  Denies any vaginal bleeding  Has trouble sleeping has Ambien   Physical Exam:BP 126/85 (BP Location: Left Arm, Patient Position: Sitting, Cuff Size: Normal)   Pulse 93   Ht 5' 7.5" (1.715 m)   Wt 191 lb (86.6 kg)   BMI 29.47 kg/m   General:  Well developed, well nourished, no acute distress Skin:  Warm and dry Neck:  Midline trachea, right nodule on  thyroid,(sees Heather Gill) good ROM, no lymphadenopathy, no carotid bruits heard  Lungs; Clear to auscultation bilaterally Breast:  No dominant palpable mass, retraction, or nipple discharge Cardiovascular: Regular rate and rhythm Abdomen:  Soft, non tender, no hepatosplenomegaly Pelvic:  External genitalia is normal in appearance, no lesions.  The vagina is pale. Urethra has no lesions or masses. The cervix is smooth, pap with HR HPV genotyping performed.  Uterus is felt to be normal size, shape, and contour.  No adnexal masses or tenderness noted.Bladder is non tender, no masses felt. Rectal: Good sphincter tone, no polyps, or hemorrhoids felt.  Hemoccult negative. Extremities/musculoskeletal:  No swelling or varicosities noted, no clubbing or cyanosis Psych:  No Gill changes, alert and cooperative,seems happy AA is 3 Fall risk is low    04/21/2023    1:37 PM 05/01/2021    1:42 PM 04/30/2020    2:48 PM  Depression screen PHQ 2/9   Decreased Interest 0 0 0  Down, Depressed, Hopeless 0 0 0  PHQ - 2 Score 0 0 0  Altered sleeping 1 3 3   Tired, decreased energy 2 2 3   Change in appetite 0 0 0  Feeling bad or failure about yourself  0 0 0  Trouble concentrating 2 0 0  Moving slowly or fidgety/restless 0 0 0  Suicidal thoughts 0 0 0  PHQ-9 Score 5 5 6        04/21/2023    1:37 PM 05/01/2021    1:42 PM 04/30/2020    2:48 PM  GAD 7 : Generalized Anxiety Score  Nervous, Anxious, on Edge 1 0 0  Control/stop worrying 0 0 0  Worry too much - different things 1 0 0  Trouble relaxing 1 2 0  Restless 0 0 0  Easily annoyed or irritable 0 0 0  Afraid - awful might happen 0 0 0  Total GAD 7 Score 3 2 0      Upstream - 04/21/23 1341       Pregnancy Intention Screening   Does the patient want to become pregnant in the next year? N/A    Does the patient's partner want to become pregnant in the next year? N/A    Would the patient like to discuss contraceptive options today? N/A      Contraception Wrap Up   Current Method Female Sterilization   PM   End Method Female Sterilization   PM   Contraception Counseling Provided No  Examination chaperoned by Heather Mood LPN  Impression and plan: 1. Encounter for gynecological examination with Papanicolaou smear of cervix (Primary) Pap sent Physical in 2 years Labs with Heather Gill Mammogram was negative 12/03/22 Colonoscopy due  - Cytology - PAP( [place]) Stay active  Has appt with Heather Gill, for eyes tomorrow   2. Encounter for screening fecal occult blood testing Hemoccult was negative  - POCT occult blood stool  3. Thyroid nodule Right thyroid nodule, follow up with Heather Gill   4. Anxiety and depression Has refills on effexor XR Has appt to see Heather Gill psychiatry in [facility] 06/08/23

## 2023-04-22 DIAGNOSIS — H401131 Primary open-angle glaucoma, bilateral, mild stage: Secondary | ICD-10-CM | POA: Diagnosis not present

## 2023-04-23 LAB — CYTOLOGY - PAP
Adequacy: ABSENT
Comment: NEGATIVE
Diagnosis: NEGATIVE
High risk HPV: NEGATIVE

## 2023-04-26 ENCOUNTER — Other Ambulatory Visit: Payer: Self-pay | Admitting: Adult Health

## 2023-04-28 ENCOUNTER — Other Ambulatory Visit: Payer: Self-pay | Admitting: *Deleted

## 2023-04-29 MED ORDER — TRAZODONE HCL 100 MG PO TABS: 100.0000 mg | ORAL_TABLET | Freq: Every day | ORAL | 0 refills | Status: DC

## 2023-06-08 ENCOUNTER — Encounter: Payer: Self-pay | Admitting: Physician Assistant

## 2023-06-08 ENCOUNTER — Ambulatory Visit (INDEPENDENT_AMBULATORY_CARE_PROVIDER_SITE_OTHER): Admitting: Physician Assistant

## 2023-06-08 VITALS — BP 137/85 | HR 93 | Ht 67.0 in | Wt 186.0 lb

## 2023-06-08 DIAGNOSIS — F3342 Major depressive disorder, recurrent, in full remission: Secondary | ICD-10-CM | POA: Diagnosis not present

## 2023-06-08 DIAGNOSIS — G4700 Insomnia, unspecified: Secondary | ICD-10-CM | POA: Diagnosis not present

## 2023-06-08 DIAGNOSIS — Z8659 Personal history of other mental and behavioral disorders: Secondary | ICD-10-CM

## 2023-06-08 DIAGNOSIS — Z8759 Personal history of other complications of pregnancy, childbirth and the puerperium: Secondary | ICD-10-CM | POA: Diagnosis not present

## 2023-06-08 DIAGNOSIS — F411 Generalized anxiety disorder: Secondary | ICD-10-CM

## 2023-06-08 MED ORDER — TRAZODONE HCL 100 MG PO TABS: 100.0000 mg | ORAL_TABLET | Freq: Every day | ORAL | 1 refills | Status: DC

## 2023-06-08 MED ORDER — ZOLPIDEM TARTRATE 10 MG PO TABS: 10.0000 mg | ORAL_TABLET | Freq: Every evening | ORAL | 5 refills | Status: DC | PRN

## 2023-06-08 MED ORDER — VENLAFAXINE HCL ER 75 MG PO CP24: 75.0000 mg | ORAL_CAPSULE | Freq: Every day | ORAL | 1 refills | Status: DC

## 2023-06-08 NOTE — Progress Notes (Signed)
 [facility] MD/PA/NP Initial Note  06/08/2023 3:51 PM Heather Gill Gill  MRN:  [id number]  Chief Complaint:  Chief Complaint   Establish Care    HPI:   Heather Gill Gill presents to establish care and get refills on her current medications.  She saw Dr. Renetta Carter until her retirement several years ago.  She has most recently been prescribed her medications by her GYN NP, Lendia Quay.  [name] is also her best friend however and [name] does not want to bother her with the medication needs.  She has a diagnosis of anxiety, depression, and insomnia and has been on her current treatment for a long time, probably years.  She feels like the Effexor, trazodone , and Ambien  are working as they should.  She also has a history of postpartum depression.  Patient is able to enjoy things.  She does not work outside the home, she loves to read and spends time with her grandkids.  She has 2 sons who were married and one lives in [place] and the other in [place] so she is able to spend time with each family fairly regularly.  Energy and motivation are good.  No extreme sadness, tearfulness, or feelings of hopelessness.  Sleeps well most of the time.  The trazodone  does help and Ambien  does too.  She has tried to take 5 mg of Ambien  but it usually does not work as well.  If she does not take something to help her sleep, it can be a couple of days without sleep which makes her miserable.  ADLs and personal hygiene are normal.  She has chronic back pain which sometimes limits how much she can do in the home.  Denies any changes in concentration, making decisions, or remembering things.  Appetite has not changed.  Weight is stable.  She is not having a lot of anxiety right now.  Denies suicidal or homicidal thoughts.  Patient denies increased energy with decreased need for sleep, increased talkativeness, racing thoughts, impulsivity or risky behaviors, increased spending, increased libido, grandiosity, increased irritability  or anger, paranoia, or hallucinations.  Visit Diagnosis:    ICD-10-CM   1. Recurrent major depression in full remission (HCC)  F33.42     2. History of postpartum depression  Z87.59    Z86.59     3. Generalized anxiety disorder  F41.1     4. Insomnia, unspecified type  G47.00      Past Psychiatric History:   Past medications for mental health diagnoses include: Lexapro, Prozac, Zoloft, Celexa, Cymbalta, Trintellix, Trazodone  for sleep, Elavil Ativan, Ambien  Geodon, Seroquel Ritalin caused tachycardia  No psych hosp stay. No suicide attempts.   Past Medical History:  Past Medical History:  Diagnosis Date   Anxiety    Blood in urine    Dr Lyndon Santiago told her 6 yrs ago-no problems   Depression    Glaucoma    Hormone replacement therapy (HRT)    Hx of spinal fusion    Hypertension    Hypothyroid 01/22/2015   Hypothyroidism    Osteopenia    Raynauds syndrome    Thyroid  nodule    Urinary incontinence 02/23/2013    Past Surgical History:  Procedure Laterality Date   BACK SURGERY     3 surg-last with fusion and rods   BACK SURGERY     COLONOSCOPY N/A 03/16/2012   Procedure: COLONOSCOPY;  Surgeon: Ruby Corporal, MD;  Location[facility];  Service: Endoscopy;  Laterality: N/A;  730   DIAGNOSTIC LAPAROSCOPY  dx   FOOT TENDON SURGERY     TUBAL LIGATION      Family Psychiatric History:  See below  Family History:  Family History  Problem Relation Age of Onset   Cancer Mother        colon   Varicose Veins Mother    Stroke Father    Hypertension Sister    Diabetes Brother    Cancer Maternal Aunt    Cancer Paternal Aunt    Cancer Maternal Uncle    Cancer Paternal Uncle     Social History:  Social History   Socioeconomic History   Marital status: Married    Spouse name: Sammie Crigler   Number of children: 2   Years of education: BA   Highest education level: Not on file  Occupational History    Employer: OTHER    Comment: disabled  Tobacco Use    Smoking status: Never   Smokeless tobacco: Never  Vaping Use   Vaping status: Never Used  Substance and Sexual Activity   Alcohol use: Yes    Alcohol/week: 4.0 standard drinks of alcohol    Types: 4 Glasses of wine per week    Comment: wine 2-3 glasses 2-3 days out of the week   Drug use: No   Sexual activity: Yes    Birth control/protection: Post-menopausal, Surgical    Comment: tubal  Other Topics Concern   Not on file  Social History Narrative   She had a good childhood.     She's a twin   Patient lives at home with spouse. Also a 100 year old pug.    She hasn't worked outside the home for about 30 years d/t health problems. Worked in [employer], community relations.   Husband works for Engineer, water.    Has 2 adult sons, they're on their own, she has 2 grandsons and 1 granddaughter.      Caffeine Use: 0-1   Legal-none   Religion-Protestant, episcopal      Social Drivers of Health   Financial Resource Strain: Low Risk  (06/08/2023)   Overall Financial Resource Strain (CARDIA)    Difficulty of Paying Living Expenses: Not hard at all  Food Insecurity: No Food Insecurity (06/08/2023)   Hunger Vital Sign    Worried About Running Out of Food in the Last Year: Never true    Ran Out of Food in the Last Year: Never true  Transportation Needs: No Transportation Needs (06/08/2023)   PRAPARE - Administrator, Civil Service (Medical): No    Lack of Transportation (Non-Medical): No  Physical Activity: Inactive (06/08/2023)   Exercise Vital Sign    Days of Exercise per Week: 0 days    Minutes of Exercise per Session: 0 min  Stress: No Stress Concern Present (06/08/2023)   [employer] of Occupational Health - Occupational Stress Questionnaire    Feeling of Stress : Not at all  Social Connections: Moderately Integrated (06/08/2023)   Social Connection and Isolation Panel [NHANES]    Frequency of Communication with Friends and Family: More than three times a week     Frequency of Social Gatherings with Friends and Family: More than three times a week    Attends Religious Services: 1 to 4 times per year    Active Member of [employer] or Organizations: No    Attends Banker Meetings: Never    Marital Status: Married    Allergies:  Allergies  Allergen Reactions   Demerol [Meperidine] Itching  Lortab [Hydrocodone-Acetaminophen] Itching   Morphine And Codeine Itching   Percocet [Oxycodone-Acetaminophen] Itching    Metabolic Disorder Labs: Lab Results  Component Value Date   HGBA1C 5.7 (H) 03/15/2017   MPG 105 02/25/2016   No results found for: "PROLACTIN" Lab Results  Component Value Date   CHOL 261 (H) 03/15/2017   TRIG 311 (H) 03/15/2017   HDL 56 03/15/2017   CHOLHDL 4.7 (H) 03/15/2017   LDLCALC 143 (H) 03/15/2017   Lab Results  Component Value Date   TSH 0.964 03/18/2022   TSH 2.080 02/21/2021    Therapeutic Level Labs: No results found for: "LITHIUM" No results found for: "VALPROATE" No results found for: "CBMZ"  Current Medications: Current Outpatient Medications  Medication Sig Dispense Refill   cyclobenzaprine (FLEXERIL) 10 MG tablet Take 10 mg by mouth 3 (three) times daily as needed.     HYDROcodone-acetaminophen (NORCO/VICODIN) 5-325 MG per tablet Take 1 tablet by mouth every 8 (eight) hours as needed. Pain, usually takes a benadryl wit it to control itching.     latanoprost (XALATAN) 0.005 % ophthalmic solution Place 1 drop into both eyes at bedtime.     levothyroxine  (SYNTHROID ) 75 MCG tablet TAKE ONE TABLET ( TOTAL) BY MOUTH DAILY BEFORE BREAKFAST 90 tablet 3   ondansetron (ZOFRAN) 4 MG tablet Take 4 mg by mouth every 6 (six) hours as needed.     pantoprazole (PROTONIX) 40 MG tablet Take 40 mg by mouth daily.     promethazine  (PHENERGAN ) 25 MG tablet Take 25 mg by mouth every 8 (eight) hours as needed for nausea or vomiting.     valACYclovir  (VALTREX ) 1000 MG tablet TAKE TWO TABLETS BY MOUTH TODAY AND TWO  TABLETS IN THE MORNING FOR COLD SORES ASNEEDED 10 tablet prn   traZODone  (DESYREL ) 100 MG tablet Take 1 tablet (100 mg total) by mouth at bedtime. 90 tablet 1   venlafaxine XR (EFFEXOR-XR) 75 MG 24 hr capsule Take 1 capsule (75 mg total) by mouth daily. 90 capsule 1   zolpidem  (AMBIEN ) 10 MG tablet Take 1 tablet (10 mg total) by mouth at bedtime as needed for sleep. 30 tablet 5   No current facility-administered medications for this visit.    Medication Side Effects: none  Orders placed this visit:  No orders of the defined types were placed in this encounter.   Psychiatric Specialty Exam:  Review of Systems  Constitutional: Negative.   HENT: Negative.    Eyes:  Positive for visual disturbance.       Has glaucoma and follows up with ophthalmology  Respiratory: Negative.    Cardiovascular: Negative.   Gastrointestinal:  Positive for diarrhea, nausea and vomiting.       Chronic GERD.  Chronic sporadic nausea and vomiting relieved with Zofran or Phenergan .  Occasional diarrhea.  Endocrine: Negative.   Genitourinary: Negative.   Musculoskeletal:        Chronic neck, back, and joint pain, also myalgias at times.  Skin: Negative.   Allergic/Immunologic: Negative.   Neurological: Negative.   Hematological: Negative.   Psychiatric/Behavioral:         See HPI    Blood pressure 137/85, pulse 93, height 5\' 7"  (1.702 m), weight 186 lb (84.4 kg).Body mass index is 29.13 kg/m.  General Appearance: Casual and Well Groomed  Eye Contact:  Good  Speech:  Clear and Coherent and Normal Rate  Volume:  Normal  Mood:  Euthymic  Affect:  Congruent  Thought Process:  Goal Directed and Descriptions  of Associations: Circumstantial  Orientation:  Full (Time, Place, and Person)  Thought Content: Logical   Suicidal Thoughts:  No  Homicidal Thoughts:  No  Memory:  WNL  Judgement:  Good  Insight:  Good  Psychomotor Activity:  Normal  Concentration:  Concentration: Good  Recall:  Good  Fund of  Knowledge: Good  Language: Good  Assets:  Communication Skills Desire for Improvement Financial Resources/Insurance Housing Resilience Social Support Transportation  ADL's:  Intact  Cognition: WNL  Prognosis:  Good   AUDIT    Garment/textile technologist Visit from 05/01/2021 in [facility] for [facility] at [facility] Office Visit from 04/30/2020 in [facility] Healthcare at [facility]  Alcohol Use Disorder Identification Test Final Score (AUDIT) 3 4      GAD-7    Flowsheet Row Office Visit from 04/21/2023 in [facility] for [facility] at [facility] Office Visit from 05/01/2021 in [facility] for [facility] at [facility] Office Visit from 04/30/2020 in [facility] for [facility] at [facility]  Total GAD-7 Score 3 2 0      PHQ2-9    Flowsheet Row Office Visit from 06/08/2023 in [facility] Office Visit from 04/21/2023 in [facility] for [facility] at [facility] Office Visit from 05/01/2021 in [facility] for [facility] Healthcare at [facility] Office Visit from 04/30/2020 in [facility] for [facility]'s Healthcare at [facility] Office Visit from 03/22/2017 in [facility]  PHQ-2 Total Score 0 0 0 0 0  PHQ-9 Total Score -- 5 5 6  --      Flowsheet Row ED from 05/05/2021 in [facility] [place]  C-SSRS RISK CATEGORY No Risk       Screenings:  AUDIT    Flowsheet Row Office Visit from 05/01/2021 in [facility] for [facility] Healthcare at [facility] Office Visit from 04/30/2020 in [facility] for Women[facility] Healthcare at [facility]  Alcohol Use Disorder Identification Test Final Score (AUDIT) 3 4      GAD-7    Flowsheet Row Office Visit from 04/21/2023 in [facility] for [facility] Healthcare at [facility] Office Visit from 05/01/2021 in [facility] for [facility] Healthcare at [facility] Office Visit from 04/30/2020  in [facility] for Women[facility] Healthcare at [facility]  Total GAD-7 Score 3 2 0      PHQ2-9    Flowsheet Row Office Visit from 06/08/2023 in [facility] Office Visit from 04/21/2023 in [facility] for [facility] at [facility] Office Visit from 05/01/2021 in [facility] for [facility] at [facility] Office Visit from 04/30/2020 in [facility] for [facility] at [facility] Office Visit from 03/22/2017 in [facility]  PHQ-2 Total Score 0 0 0 0 0  PHQ-9 Total Score -- 5 5 6  --      Flowsheet Row ED from 05/05/2021 in [facility] at [facility] RISK CATEGORY No Risk      Receiving Psychotherapy: No   Treatment Plan/Recommendations:   PDMP reviewed.  Ambien  filled 05/26/2023.  Hydrocodone given for 03/01/2023. I provided 60 minutes of face to face time during this encounter, including time spent before and after the visit in records review, medical decision making, counseling pertinent to today's visit, and charting.   Demyah has been doing well for years on  the current medications.  No changes are necessary.    We discussed the possible addictive potential of Ambien  and also the fact that it can cause confusion and falls in the elderly, but in my opinion going for several days without sleeping can be dangerous as well.  She understands these risks and accepts and will take the lowest effective dose.  Continue trazodone  100 mg, 1/2-1 p.o. nightly as needed sleep. Continue Effexor XR 75 mg, 1 p.o. daily. Continue Ambien  10 mg, 1/2-1 p.o. nightly as needed sleep. Return in 6 months.  Marvia Slocumb, PA-C

## 2023-06-15 ENCOUNTER — Ambulatory Visit: Payer: Medicare Other | Admitting: Nurse Practitioner

## 2023-09-09 ENCOUNTER — Ambulatory Visit: Admitting: Nurse Practitioner

## 2023-10-28 DIAGNOSIS — H25813 Combined forms of age-related cataract, bilateral: Secondary | ICD-10-CM | POA: Diagnosis not present

## 2023-12-09 ENCOUNTER — Ambulatory Visit: Admitting: Physician Assistant

## 2023-12-09 ENCOUNTER — Encounter: Payer: Self-pay | Admitting: Physician Assistant

## 2023-12-09 DIAGNOSIS — F3342 Major depressive disorder, recurrent, in full remission: Secondary | ICD-10-CM

## 2023-12-09 DIAGNOSIS — G4700 Insomnia, unspecified: Secondary | ICD-10-CM

## 2023-12-09 DIAGNOSIS — F411 Generalized anxiety disorder: Secondary | ICD-10-CM | POA: Diagnosis not present

## 2023-12-09 MED ORDER — ZOLPIDEM TARTRATE 10 MG PO TABS: 10.0000 mg | ORAL_TABLET | Freq: Every evening | ORAL | 5 refills | Status: AC | PRN

## 2023-12-09 MED ORDER — TRAZODONE HCL 100 MG PO TABS: 100.0000 mg | ORAL_TABLET | Freq: Every day | ORAL | 1 refills | Status: AC

## 2023-12-09 MED ORDER — LORAZEPAM 0.5 MG PO TABS: 0.5000 mg | ORAL_TABLET | Freq: Three times a day (TID) | ORAL | 0 refills | Status: DC | PRN

## 2023-12-09 MED ORDER — VENLAFAXINE HCL ER 75 MG PO CP24: 75.0000 mg | ORAL_CAPSULE | Freq: Every day | ORAL | 1 refills | Status: AC

## 2023-12-09 NOTE — Progress Notes (Signed)
 [facility] Med Check  Patient ID: Heather Gill,  MRN: [id number]  PCP: Sheryle Carwin, MD  Date of Evaluation: 12/09/2023 Time spent:20 minutes  Chief Complaint:  Chief Complaint   Depression; Anxiety; Insomnia; Follow-up    HISTORY/CURRENT STATUS: HPI  For routine 6 month med check.   Heather Gill is doing well.  Feels that her medications are working well.  She has had a busy summer and fall, she and her husband traveled a lot and enjoyed that.  Energy and motivation are good.  No extreme sadness, tearfulness, or feelings of hopelessness.  Sleeps well with the current meds.  ADLs and personal hygiene are normal.   Denies any changes in concentration, making decisions, or remembering things.  Appetite has not changed.  Weight is stable.  She does not get anxious very often, maybe with flying or going to the dentist.  No mania, delirium, AH/VH.  No SI/HI.  She has a question about Ambien  and trazodone , she read something stating they may cause this or contribute to dementia and wonders if that is the case.  Individual Medical History/ Review of Systems: Changes? :No   Past medications for mental health diagnoses include: Lexapro, Prozac, Zoloft, Celexa, Cymbalta, Trintellix, Trazodone  for sleep, Elavil Ativan, Ambien  Geodon, Seroquel Ritalin caused tachycardia  No psych hosp stay. No suicide attempts.   Allergies: Demerol [meperidine], Lortab [hydrocodone-acetaminophen], Morphine and codeine, and Percocet [oxycodone-acetaminophen]  Current Medications:  Current Outpatient Medications:    cyclobenzaprine (FLEXERIL) 10 MG tablet, Take 10 mg by mouth 3 (three) times daily as needed., Disp: , Rfl:    HYDROcodone-acetaminophen (NORCO/VICODIN) 5-325 MG per tablet, Take 1 tablet by mouth every 8 (eight) hours as needed. Pain, usually takes a benadryl wit it to control itching., Disp: , Rfl:    latanoprost (XALATAN) 0.005 % ophthalmic solution, Place 1 drop into both eyes at bedtime., Disp:  , Rfl:    levothyroxine  (SYNTHROID ) 75 MCG tablet, TAKE ONE TABLET ( TOTAL) BY MOUTH DAILY BEFORE BREAKFAST, Disp: 90 tablet, Rfl: 3   LORazepam (ATIVAN) 0.5 MG tablet, Take 1 tablet (0.5 mg total) by mouth every 8 (eight) hours as needed for anxiety., Disp: 20 tablet, Rfl: 0   ondansetron (ZOFRAN) 4 MG tablet, Take 4 mg by mouth every 6 (six) hours as needed., Disp: , Rfl:    pantoprazole (PROTONIX) 40 MG tablet, Take 40 mg by mouth daily., Disp: , Rfl:    promethazine  (PHENERGAN ) 25 MG tablet, Take 25 mg by mouth every 8 (eight) hours as needed for nausea or vomiting., Disp: , Rfl:    valACYclovir  (VALTREX ) 1000 MG tablet, TAKE TWO TABLETS BY MOUTH TODAY AND TWO TABLETS IN THE MORNING FOR COLD SORES ASNEEDED, Disp: 10 tablet, Rfl: prn   traZODone  (DESYREL ) 100 MG tablet, Take 1 tablet (100 mg total) by mouth at bedtime., Disp: 90 tablet, Rfl: 1   venlafaxine  XR (EFFEXOR -XR) 75 MG 24 hr capsule, Take 1 capsule (75 mg total) by mouth daily., Disp: 90 capsule, Rfl: 1   zolpidem  (AMBIEN ) 10 MG tablet, Take 1 tablet (10 mg total) by mouth at bedtime as needed for sleep., Disp: 30 tablet, Rfl: 5 Medication Side Effects: none  Family Medical/ Social History: Changes? No  MENTAL HEALTH EXAM:  There were no vitals taken for this visit.There is no height or weight on file to calculate BMI.  General Appearance: Casual and Well Groomed  Eye Contact:  Good  Speech:  Clear and Coherent and Normal Rate  Volume:  Normal  Mood:  Euthymic  Affect:  Congruent  Thought Process:  Goal Directed and Descriptions of Associations: Circumstantial  Orientation:  Full (Time, Place, and Person)  Thought Content: Logical   Suicidal Thoughts:  No  Homicidal Thoughts:  No  Memory:  WNL  Judgement:  Good  Insight:  Good  Psychomotor Activity:  Normal  Concentration:  Concentration: Good  Recall:  Good  Fund of Knowledge: Good  Language: Good  Assets:  Communication Skills Desire for Improvement Financial  Resources/Insurance Housing Leisure Time Resilience Transportation  ADL's:  Intact  Cognition: WNL  Prognosis:  Good   DIAGNOSES:    ICD-10-CM   1. Recurrent major depression in full remission  F33.42     2. Generalized anxiety disorder  F41.1     3. Insomnia, unspecified type  G47.00      Receiving Psychotherapy: No   RECOMMENDATIONS:   PDMP reviewed.  Ambien  filled 11/25/2023. I provided approximately 20 minutes of face to face time during this encounter, including time spent before and after the visit in records review, medical decision making, counseling pertinent to today's visit, and charting.   She is doing well on her current medications so no changes will be made.  I am prescribing a few Ativan for her to take for dental work or flying for example.   We discussed the fact that dementia from benzos or sedatives such as Ambien  has been refuted.  Start Ativan 0.5 mg, 1Q8H as needed anxiety. Continue trazodone  100 mg, 1 p.o. nightly as needed. Continue Effexor  XR 75 mg, 1 p.o. daily. Continue Ambien  10 mg, 1 p.o. nightly as needed. Return in 6 months.  Verneita Cooks, PA-C

## 2023-12-16 ENCOUNTER — Ambulatory Visit

## 2023-12-16 ENCOUNTER — Ambulatory Visit
Admission: EM | Admit: 2023-12-16 | Discharge: 2023-12-16 | Disposition: A | Discharge status: Home / Self Care | Attending: Family Medicine | Admitting: Family Medicine

## 2023-12-16 DIAGNOSIS — M25532 Pain in left wrist: Secondary | ICD-10-CM | POA: Diagnosis not present

## 2023-12-16 DIAGNOSIS — S6992XA Unspecified injury of left wrist, hand and finger(s), initial encounter: Secondary | ICD-10-CM | POA: Diagnosis not present

## 2023-12-16 NOTE — Discharge Instructions (Signed)
 Your x-ray today per the radiologist is showing a possible triquetral fracture, which is one of the carpal bones in your wrist.  We have immobilized your arm with a splint today and you may elevate at rest, take ibuprofen and Tylenol as needed.  I have placed orthopedic information in the follow-up section and you may give them a call to follow-up in the next few days for further evaluation to determine if there is a true fracture or not.

## 2023-12-16 NOTE — ED Triage Notes (Signed)
 Pt being seen in UC for L arm injury after fall at home last night. Pt reports falling onto hardwood floors at home. Pt reports pain in wrist area on L arm. Pt denies otc medication, pt reports taking prescription medication today to help with pain.

## 2023-12-16 NOTE — ED Provider Notes (Signed)
 [facility]    CSN: [id number] Arrival date & time: 12/16/23  1350      History   Chief Complaint Chief Complaint  Patient presents with   Fall   Arm Injury    HPI Heather Gill is a 68 y.o. female.   Patient resenting today with left wrist pain after a fall at home last night.  She states she fell and caught herself with her left hand and has had pain, bruising, swelling to the area.  Denies numbness, tingling, weakness, skin injury.  So far trying over-the-counter pain relievers with minimal relief.    Past Medical History:  Diagnosis Date   Anxiety    Blood in urine    Dr Verdene told her 6 yrs ago-no problems   Depression    Glaucoma    Hormone replacement therapy (HRT)    Hx of spinal fusion    Hypertension    Hypothyroid 01/22/2015   Hypothyroidism    Osteopenia    Raynauds syndrome    Thyroid  nodule    Urinary incontinence 02/23/2013    Patient Active Problem List   Diagnosis Date Noted   Anxiety and depression 04/21/2023   Thyroid  nodule 04/21/2023   Encounter for well woman exam with routine gynecological exam 05/01/2021   Encounter for screening fecal occult blood testing 04/30/2020   Nodular goiter 09/06/2018   Encounter for gynecological examination with Papanicolaou smear of cervix 03/22/2017   Vitamin D  deficiency 05/15/2015   Osteopenia 04/03/2015   Hypothyroidism 01/22/2015   Postmenopausal HRT (hormone replacement therapy) 02/23/2013   Urinary incontinence 02/23/2013    Past Surgical History:  Procedure Laterality Date   BACK SURGERY     3 surg-last with fusion and rods   BACK SURGERY     COLONOSCOPY N/A 03/16/2012   Procedure: COLONOSCOPY;  Surgeon: Claudis RAYMOND Rivet, MD;  Location: [facility];  Service: Endoscopy;  Laterality: N/A;  730   DIAGNOSTIC LAPAROSCOPY     dx   FOOT TENDON SURGERY     TUBAL LIGATION      OB History     Gravida  2   Para  2   Term  0   Preterm  0   AB  0   Living  2      SAB   0   IAB  0   Ectopic  0   Multiple  0   Live Births  2            Home Medications    Prior to Admission medications   Medication Sig Start Date End Date Taking? Authorizing Provider  cyclobenzaprine (FLEXERIL) 10 MG tablet Take 10 mg by mouth 3 (three) times daily as needed. 08/03/19   [provider]  HYDROcodone-acetaminophen (NORCO/VICODIN) 5-325 MG per tablet Take 1 tablet by mouth every 8 (eight) hours as needed. Pain, usually takes a benadryl wit it to control itching.    [provider]  latanoprost (XALATAN) 0.005 % ophthalmic solution Place 1 drop into both eyes at bedtime.    [provider]  levothyroxine  (SYNTHROID ) 75 MCG tablet TAKE ONE TABLET ( TOTAL) BY MOUTH DAILY BEFORE BREAKFAST 03/30/23   Therisa Benton PARAS, NP  LORazepam (ATIVAN) 0.5 MG tablet Take 1 tablet (0.5 mg total) by mouth every 8 (eight) hours as needed for anxiety. Patient not taking: Reported on 12/16/2023 12/09/23   Rhys Boyer T, PA-C  ondansetron (ZOFRAN) 4 MG tablet Take 4 mg by mouth  every 6 (six) hours as needed. 03/31/21   [provider]  pantoprazole (PROTONIX) 40 MG tablet Take 40 mg by mouth daily. 08/18/19   [provider]  promethazine  (PHENERGAN ) 25 MG tablet Take 25 mg by mouth every 8 (eight) hours as needed for nausea or vomiting.    [provider]  traZODone  (DESYREL ) 100 MG tablet Take 1 tablet (100 mg total) by mouth at bedtime. 12/09/23   Rhys Boyer T, PA-C  valACYclovir  (VALTREX ) 1000 MG tablet TAKE TWO TABLETS BY MOUTH TODAY AND TWO TABLETS IN THE MORNING FOR COLD SORES ASNEEDED 03/10/23   Signa Delon LABOR, NP  venlafaxine  XR (EFFEXOR -XR) 75 MG 24 hr capsule Take 1 capsule (75 mg total) by mouth daily. 12/09/23   Rhys Boyer T, PA-C  zolpidem  (AMBIEN ) 10 MG tablet Take 1 tablet (10 mg total) by mouth at bedtime as needed for sleep. 12/09/23   Rhys Boyer DASEN, PA-C    Family History Family History  Problem  Relation Age of Onset   Cancer Mother        colon   Varicose Veins Mother    Stroke Father    Hypertension Sister    Diabetes Brother    Cancer Maternal Aunt    Cancer Paternal Aunt    Cancer Maternal Uncle    Cancer Paternal Uncle     Social History Social History   Tobacco Use   Smoking status: Never   Smokeless tobacco: Never  Vaping Use   Vaping status: Never Used  Substance Use Topics   Alcohol use: Yes    Alcohol/week: 4.0 standard drinks of alcohol    Types: 4 Glasses of wine per week    Comment: wine 2-3 glasses 2-3 days out of the week   Drug use: No     Allergies   Demerol [meperidine], Lortab [hydrocodone-acetaminophen], Morphine and codeine, and Percocet [oxycodone-acetaminophen]   Review of Systems Review of Systems PER HPI  Physical Exam Triage Vital Signs ED Triage Vitals  Encounter Vitals Group     BP 12/16/23 1402 132/86     Girls Systolic BP Percentile --      Girls Diastolic BP Percentile --      Boys Systolic BP Percentile --      Boys Diastolic BP Percentile --      Pulse Rate 12/16/23 1402 93     Resp 12/16/23 1402 19     Temp 12/16/23 1402 98.2 F (36.8 C)     Temp Source 12/16/23 1402 Oral     SpO2 12/16/23 1402 93 %     Weight --      Height --      Head Circumference --      Peak Flow --      Pain Score 12/16/23 1403 9     Pain Loc --      Pain Education --      Exclude from Growth Chart --    No data found.  Updated Vital Signs BP 132/86 (BP Location: Right Arm)   Pulse 93   Temp 98.2 F (36.8 C) (Oral)   Resp 19   SpO2 93%   Visual Acuity Right Eye Distance:   Left Eye Distance:   Bilateral Distance:    Right Eye Near:   Left Eye Near:    Bilateral Near:     Physical Exam Vitals and nursing note reviewed.  Constitutional:      Appearance: Normal appearance. She is not ill-appearing.  HENT:     Head: Atraumatic.  Eyes:     Extraocular Movements: Extraocular movements intact.     Conjunctiva/sclera:  Conjunctivae normal.  Cardiovascular:     Rate and Rhythm: Normal rate.  Pulmonary:     Effort: Pulmonary effort is normal.  Musculoskeletal:        General: Swelling, tenderness and signs of injury present. No deformity. Normal range of motion.     Cervical back: Normal range of motion and neck supple.     Comments: Left ulnar aspect of wrist edematous, ttp. No bony deformity palpable  Skin:    General: Skin is warm and dry.     Findings: No erythema.  Neurological:     Mental Status: She is alert and oriented to person, place, and time.     Comments: LUE neurovascularly intact  Psychiatric:        Mood and Affect: Mood normal.        Thought Content: Thought content normal.        Judgment: Judgment normal.     UC Treatments / Results  Labs (all labs ordered are listed, but only abnormal results are displayed) Labs Reviewed - No data to display  EKG  Radiology DG Wrist Complete Left Result Date: 12/16/2023 EXAM: 3 OR MORE VIEW(S) XRAY OF THE LEFT WRIST 12/16/2023 02:13:36 PM COMPARISON: None available. CLINICAL HISTORY: injury FINDINGS: BONES AND JOINTS: Linear ossific density along the dorsal aspect of carpal bones noted on the lateral and oblique projection radiographs, which may reflect a triquetral fracture. Moderate first CMC and triscaphe degenerative change. No joint dislocation. SOFT TISSUES: Soft tissue swelling. IMPRESSION: 1. Possible triquetral fracture. 2. Soft tissue swelling. Electronically signed by: Waddell Calk MD 12/16/2023 02:56 PM EST RP Workstation: [id number]    Procedures Procedures (including critical care time)  Medications Ordered in UC Medications - No data to display  Initial Impression / Assessment and Plan / UC Course  I have reviewed the triage vital signs and the nursing notes.  Pertinent labs & imaging results that were available during my care of the patient were reviewed by me and considered in my medical decision making (see chart for  details).     X-ray of the left wrist showing a possible triquetral fracture so volar splint placed.  Discussed elevation, over-the-counter pain relievers, orthopedic follow-up for further evaluation and management.  Return for worsening or unresolving symptoms.  Final Clinical Impressions(s) / UC Diagnoses   Final diagnoses:  Left wrist pain     Discharge Instructions      Your x-ray today per the radiologist is showing a possible triquetral fracture, which is one of the carpal bones in your wrist.  We have immobilized your arm with a splint today and you may elevate at rest, take ibuprofen and Tylenol as needed.  I have placed orthopedic information in the follow-up section and you may give them a call to follow-up in the next few days for further evaluation to determine if there is a true fracture or not.    ED Prescriptions   None    PDMP not reviewed this encounter.   Stuart Vernell Norris, [place] 12/16/23 1637

## 2023-12-22 ENCOUNTER — Ambulatory Visit: Admitting: Orthopedic Surgery

## 2023-12-22 ENCOUNTER — Encounter: Payer: Self-pay | Admitting: Orthopedic Surgery

## 2023-12-22 VITALS — BP 117/87 | HR 91 | Ht 67.0 in | Wt 186.0 lb

## 2023-12-22 DIAGNOSIS — W010XXA Fall on same level from slipping, tripping and stumbling without subsequent striking against object, initial encounter: Secondary | ICD-10-CM | POA: Diagnosis not present

## 2023-12-22 DIAGNOSIS — S62112A Displaced fracture of triquetrum [cuneiform] bone, left wrist, initial encounter for closed fracture: Secondary | ICD-10-CM | POA: Diagnosis not present

## 2023-12-22 NOTE — Progress Notes (Signed)
 New Patient Visit  Assessment: Heather Gill is a 68 y.o. female with the following: 1. Closed chip fracture of triquetrum of left wrist, initial encounter  Plan: Heather Gill slipped on some fresh hardwood floors and injured her left wrist.  She was noted to have an avulsion fracture of the triquetrum.  This is amenable to nonoperative management.  I have recommended a brace.  Okay to remove the brace for gentle motion, as well as hygiene.  If she has any further issues, she will return to clinic.  Otherwise, I will see her in 3 weeks.  Elevate the hand to help with swelling.  She already has some pain medications.  Follow-up: Return in about 3 weeks (around 01/12/2024).  Subjective:  Chief Complaint  Patient presents with   Wrist Pain    L after a fall DOI 12/15/23    History of Present Illness: Heather Gill is a 68 y.o. female who presents for evaluation of left wrist pain.  She states that she had some fresh hardwood floors at home, and slipped in socks.  She impacted her left wrist.  She had immediate pain.  She was evaluated at an urgent care center.  Radiographs demonstrated a small fracture off the dorsal aspect of the triquetrum.  She was placed in a splint.  She takes hydrocodone as needed for back pain.  This is help with the wrist.  She has noted improvement in her symptoms when she elevates the wrist.  No numbness or tingling.  No pain elsewhere.   Review of Systems: No fevers or chills No numbness or tingling No chest pain No shortness of breath No bowel or bladder dysfunction No GI distress No headaches   Medical History:  Past Medical History:  Diagnosis Date   Anxiety    Blood in urine    Dr Verdene told her 6 yrs ago-no problems   Depression    Glaucoma    Hormone replacement therapy (HRT)    Hx of spinal fusion    Hypertension    Hypothyroid 01/22/2015   Hypothyroidism    Osteopenia    Raynauds syndrome    Thyroid  nodule    Urinary incontinence  02/23/2013    Past Surgical History:  Procedure Laterality Date   BACK SURGERY     3 surg-last with fusion and rods   BACK SURGERY     COLONOSCOPY N/A 03/16/2012   Procedure: COLONOSCOPY;  Surgeon: Claudis RAYMOND Rivet, MD;  Location: [facility];  Service: Endoscopy;  Laterality: N/A;  730   DIAGNOSTIC LAPAROSCOPY     dx   FOOT TENDON SURGERY     TUBAL LIGATION      Family History  Problem Relation Age of Onset   Cancer Mother        colon   Varicose Veins Mother    Stroke Father    Hypertension Heather    Diabetes Brother    Cancer Maternal Aunt    Cancer Paternal Aunt    Cancer Maternal Uncle    Cancer Paternal Uncle    Social History   Tobacco Use   Smoking status: Never   Smokeless tobacco: Never  Vaping Use   Vaping status: Never Used  Substance Use Topics   Alcohol use: Yes    Alcohol/week: 4.0 standard drinks of alcohol    Types: 4 Glasses of wine per week    Comment: wine 2-3 glasses 2-3 days out of the week   Drug use:  No    Allergies  Allergen Reactions   Demerol [Meperidine] Itching   Lortab [Hydrocodone-Acetaminophen] Itching   Morphine And Codeine Itching   Percocet [Oxycodone-Acetaminophen] Itching    Current Meds  Medication Sig   cyclobenzaprine (FLEXERIL) 10 MG tablet Take 10 mg by mouth 3 (three) times daily as needed.   HYDROcodone-acetaminophen (NORCO/VICODIN) 5-325 MG per tablet Take 1 tablet by mouth every 8 (eight) hours as needed. Pain, usually takes a benadryl wit it to control itching.   latanoprost (XALATAN) 0.005 % ophthalmic solution Place 1 drop into both eyes at bedtime.   levothyroxine  (SYNTHROID ) 75 MCG tablet TAKE ONE TABLET ( TOTAL) BY MOUTH DAILY BEFORE BREAKFAST   ondansetron (ZOFRAN) 4 MG tablet Take 4 mg by mouth every 6 (six) hours as needed.   pantoprazole (PROTONIX) 40 MG tablet Take 40 mg by mouth daily.   promethazine  (PHENERGAN ) 25 MG tablet Take 25 mg by mouth every 8 (eight) hours as needed for nausea or  vomiting.   traZODone  (DESYREL ) 100 MG tablet Take 1 tablet (100 mg total) by mouth at bedtime.   valACYclovir  (VALTREX ) 1000 MG tablet TAKE TWO TABLETS BY MOUTH TODAY AND TWO TABLETS IN THE MORNING FOR COLD SORES ASNEEDED   venlafaxine  XR (EFFEXOR -XR) 75 MG 24 hr capsule Take 1 capsule (75 mg total) by mouth daily.   zolpidem  (AMBIEN ) 10 MG tablet Take 1 tablet (10 mg total) by mouth at bedtime as needed for sleep.    Objective: BP 117/87   Pulse 91   Ht 5' 7 (1.702 m)   Wt 186 lb (84.4 kg)   BMI 29.13 kg/m   Physical Exam:  General: Alert and oriented. and No acute distress. Gait: Normal gait.  Left wrist with some mild swelling.  Bruising on the volar aspect.  Slightly restricted range of motion of the left wrist.  Tenderness to palpation over the dorsum of the wrist.  She has a chronic deformity of the ring finger.  Otherwise, the fingers are fully mobile.  Full extension and flexion.  Just short of a full fist.  Sensation intact throughout left hand.  IMAGING: I personally reviewed images previously obtained from the ED   X-rays from the urgent care were available in clinic today.  Small avulsion type fracture of the dorsal aspect of the triquetrum.   New Medications:  No orders of the defined types were placed in this encounter.     Heather DELENA Horde, MD  12/22/2023 10:15 AM

## 2024-01-12 ENCOUNTER — Ambulatory Visit: Admitting: Orthopedic Surgery

## 2024-01-18 ENCOUNTER — Encounter: Payer: Self-pay | Admitting: Orthopedic Surgery

## 2024-01-18 ENCOUNTER — Ambulatory Visit: Admitting: Orthopedic Surgery

## 2024-01-18 ENCOUNTER — Ambulatory Visit: Payer: Self-pay

## 2024-01-18 DIAGNOSIS — S62112A Displaced fracture of triquetrum [cuneiform] bone, left wrist, initial encounter for closed fracture: Secondary | ICD-10-CM

## 2024-01-18 NOTE — Patient Instructions (Signed)
Wrist Fracture Rehab Ask your health care provider which exercises are safe for you. Do exercises exactly as told by your health care provider and adjust them as directed. It is normal to feel mild stretching, pulling, tightness, or discomfort as you do these exercises. Stop right away if you feel sudden pain or your pain gets worse. Do not begin these exercises until told by your health care provider. Stretching and range-of-motion exercises These exercises warm up your muscles and joints and improve the movement and flexibility of your wrist and hand. These exercises also help to relieve pain,numbness, and tingling. Finger flexion and extension Sit or stand with your elbow at your side. Open and stretch your left / right fingers as wide as you can (extension). Hold this position for 10 seconds. Close your left / right fingers into a gentle fist (flexion). Hold this position for 10 seconds. Slowly return to the starting position. Repeat 10 times. Complete this exercise 1-2 times a day. Wrist flexion Bend your left / right elbow to a 90-degree angle (right angle) with your palm facing the floor. Bend your wrist forward so your fingers point toward the floor (flexion). Hold this position for 10 seconds. Slowly return to the starting position. Repeat 10 times. Complete this exercise 1-2 times a day. Wrist extension Bend your left / right elbow to a 90-degree angle (right angle) with your palm facing the floor. Bend your wrist backward so your fingers point toward the ceiling (extension). Hold this position for 10 seconds. Slowly return to the starting position. Repeat 10 times. Complete this exercise 1-2 times a day. Ulnar deviation Bend your left / right elbow to a 90-degree angle (right angle), and rest your forearm on a table with your palm facing down. Keeping your hand flat on the table, bend your left / right wrist toward your small finger (pinkie). This is ulnar deviation. Hold this  position for 10 seconds. Slowly return to the starting position. Repeat 10 times. Complete this exercise 1-2 times a day. Radial deviation Bend your left / right elbow to a 90-degree angle (right angle), and rest your forearm on a table with your palm facing down. Keeping your hand flat on the table, bend your left / right wrist toward your thumb. This is radial deviation. Hold this position for 10 seconds. Slowly return to the starting position. Repeat 10 times. Complete this exercise 1-2 times a day. Forearm rotation, supination Stand or sit with your left / right elbow bent to a 90-degree angle (right angle) at your side. Position your forearm so that the thumb is facing the ceiling (neutral position). Turn (rotate) your palm up toward the ceiling (supination), stopping when you feel a gentle stretch. Hold this position for 10 seconds. Slowly return to the starting position. Repeat 10 times. Complete this exercise 1-2 times a day. Forearm rotation, pronation Stand or sit with your left / right elbow bent to a 90-degree angle (right angle) at your side. Position your forearm so that the thumb is facing the ceiling (neutral position). Turn (rotate) your palm down toward the floor (pronation), stopping when you feel a gentle stretch. Hold this position for 10 seconds. Slowly return to the starting position. Repeat 10 times. Complete this exercise 1-2 times a day. Wrist flexion stretch  Extend your left / right arm in front of you and turn your palm down toward the floor. If told by your health care provider, bend your left / right arm to a 90-degree angle (  right angle) at your side. Using your uninjured hand, gently press over the back of your left / right hand to bend your wrist and fingers toward the floor (flexion). Go as far as you can to feel a stretch without causing pain. Hold this position for 10 seconds. Slowly return to the starting position. Repeat 10 times. Complete this  exercise 1-2 times a day. Wrist extension stretch  Extend your left / right arm in front of you and turn your palm up toward the ceiling. If told by your health care provider, bend your left / right arm to a 90-degree angle (right angle) at your side. Using your uninjured hand, gently press over the palm of your left / right hand to bend your wrist and fingers toward the floor (extension). Go as far as you can to feel a stretch without causing pain. Hold this position for 10 seconds. Slowly return to the starting position. Repeat 10 times. Complete this exercise 1-2 times a day. Forearm rotation stretch, supination Stand or sit with your arms at your sides. Bend your left / right elbow to a 90-degree angle (right angle). Using your uninjured hand, turn your left / right palm up toward the ceiling (assisted supination) until you feel a gentle stretch in the inside of your forearm. Hold this position for 10 seconds. Slowly return to the starting position. Repeat 10 times. Complete this exercise 1-2 times a day. Forearm rotation stretch, pronation Stand or sit with your arms at your sides. Bend your left / right elbow to a 90-degree angle (right angle). Using your uninjured hand, turn your left / right palm down toward the floor (assisted pronation) until you feel a gentle stretch in the top of your forearm. Hold this position for 10 seconds. Slowly return to the starting position. Repeat 10 times. Complete this exercise 1-2 times a day. Strengthening exercises These exercises build strength and endurance in your wrist and hand. Enduranceis the ability to use your muscles for a long time, even after they get tired. Wrist flexion Sit with your left / right forearm supported on a table. Your elbow should be at waist height. Rest your hand over the edge of the table, palm up. Gently grasp a 5 lb / kg weight (can of soup). Or, hold an exercise band or tube in both hands, keeping your hands at  the same level and hip distance apart. There should be slight tension in the exercise band or tube. Without moving your forearm or elbow, slowly bend your wrist up toward the ceiling (wrist flexion). Hold this position for 10 seconds. Slowly return to the starting position. Repeat 10 times. Complete this exercise 1-2 times a day. Wrist extension Sit with your left / right forearm supported on a table. Your elbow should be at waist height. Rest your hand over the edge of the table, palm down. Gently grasp a 5 lb / kg weight. Or, hold an exercise band or tube in both hands, keeping your hands at the same level and hip distance apart. There should be slight tension in the exercise band or tube. Without moving your forearm or elbow, slowly curl your hand up toward the ceiling (extension). Hold this position for 10 seconds. Slowly return to the starting position. Repeat 10 times. Complete this exercise 1-2 times a day. Forearm rotation, supination  Sit with your left / right forearm supported on a table. Your elbow should be at waist height. Rest your hand over the edge of the  table, palm down. Gently grasp a lightweight hammer near the head. As this exercise gets easier for you, try holding the hammer farther down the handle. Without moving your elbow, slowly turn (rotate) your palm up toward the ceiling (supination). Hold this position for 10 seconds. Slowly return to the starting position. Repeat 10 times. Complete this exercise 1-2 times a day. Forearm rotation, pronation  Sit with your left / right forearm supported on a table. Your elbow should be at waist height. Rest your hand over the edge of the table, palm up. Gently grasp a lightweight hammer near the head. As this exercise gets easier for you, try holding the hammer farther down the handle. Without moving your elbow, slowly turn (rotate) your palm down toward the floor (pronation). Hold this position for 10 seconds. Slowly return  to the starting position. Repeat 10 times. Complete this exercise 1-2 times a day. Grip strengthening  Hold one of these items in your left / right hand: a dense sponge, a stress ball, or a large, rolled sock. Slowly squeeze the object as hard as you can without increasing any pain. Hold your squeeze for 10 seconds. Slowly release your grip. Repeat 10 times. Complete this exercise 1-2 times a day. This information is not intended to replace advice given to you by your health care provider. Make sure you discuss any questions you have with your healthcare provider. Document Revised: 06/08/2019 Document Reviewed: 06/08/2019 Elsevier Patient Education  [employer].

## 2024-01-20 NOTE — Progress Notes (Signed)
 Return patient Visit  Assessment: Heather Gill is a 68 y.o. female with the following: 1. Closed chip fracture of triquetrum of left wrist, initial encounter  Plan: Heather Gill slipped on some fresh hardwood floors and injured her left wrist.  Injury was approximately 1 month ago.  She is improving.  She notes some discomfort with certain activities.  She is working on range of motion.  Reviewed radiographs in clinic today which demonstrates stable avulsion type fracture.  Urged to to gradually come out of the brace on a more regular basis.  Work on range of motion.  Gradually progress activities.  I would like to see her back in a month.  She states understanding.   Follow-up: Return in about 4 weeks (around 02/15/2024).  Subjective:  Chief Complaint  Patient presents with   Wrist Injury    Left     History of Present Illness: Heather Gill is a 68 y.o. female who returns for evaluation of left wrist pain.  She injured her left wrist approximately 3-4 weeks ago.  She has an avulsion fracture.  She has been wearing a removable brace.  The pain and swelling has improved.  She still notes some stiffness in the left wrist.  She also has some weakness.  She is starting to remove the brace on a more regular basis.  Review of Systems: No fevers or chills No numbness or tingling No chest pain No shortness of breath No bowel or bladder dysfunction No GI distress No headaches    Objective: There were no vitals taken for this visit.  Physical Exam:  General: Alert and oriented. and No acute distress. Gait: Normal gait.  Left wrist without deformity.  No bruising.  Slightly restricted range of motion.  She is able to make a fist.  Fingers warm and well-perfused.  She has been diligent with her finger exercises, and shows.  Sensation intact throughout left hand.  IMAGING: I personally ordered and reviewed the following images   X-rays of the left wrist were obtained in clinic  today.  These compare to prior x-rays.  There is been no change overall.  Previous avulsion fracture is not readily apparent.  No new injuries.  No dislocation.  No degenerative changes.  No bony lesions.  Impression: Stable dorsal left hand avulsion fracture  New Medications:  No orders of the defined types were placed in this encounter.     Oneil DELENA Horde, MD  01/20/2024 4:35 PM

## 2024-02-15 ENCOUNTER — Ambulatory Visit: Admitting: Orthopedic Surgery

## 2024-06-08 ENCOUNTER — Ambulatory Visit: Admitting: Physician Assistant
